# Patient Record
Sex: Female | Born: 1970 | Hispanic: No | Marital: Married | Smoking: Never smoker
Health system: Southern US, Community
[De-identification: ages and names within clinical notes are randomized; demographics above are authoritative.]

---

## 2007-10-16 ENCOUNTER — Inpatient Hospital Stay (HOSPITAL_COMMUNITY): Admission: AD | Admit: 2007-10-16 | Discharge: 2007-10-18 | Payer: Self-pay | Admitting: Obstetrics and Gynecology

## 2008-08-16 ENCOUNTER — Ambulatory Visit (HOSPITAL_BASED_OUTPATIENT_CLINIC_OR_DEPARTMENT_OTHER): Admission: RE | Admit: 2008-08-16 | Discharge: 2008-08-16 | Payer: Self-pay | Admitting: Otolaryngology

## 2008-08-16 ENCOUNTER — Encounter (INDEPENDENT_AMBULATORY_CARE_PROVIDER_SITE_OTHER): Payer: Self-pay | Admitting: Otolaryngology

## 2008-08-23 ENCOUNTER — Ambulatory Visit: Admission: RE | Admit: 2008-08-23 | Discharge: 2008-09-13 | Payer: Self-pay | Admitting: Radiation Oncology

## 2008-08-25 ENCOUNTER — Ambulatory Visit: Payer: Self-pay | Admitting: Internal Medicine

## 2008-08-26 LAB — CBC WITH DIFFERENTIAL/PLATELET
BASO%: 1 % (ref 0.0–2.0)
Basophils Absolute: 0.1 10*3/uL (ref 0.0–0.1)
HCT: 36 % (ref 34.8–46.6)
HGB: 11.9 g/dL (ref 11.6–15.9)
MCHC: 33.1 g/dL (ref 32.0–36.0)
MONO#: 0.9 10*3/uL (ref 0.1–0.9)
NEUT%: 70.3 % (ref 39.6–76.8)
RDW: 12.3 % (ref 11.3–14.5)
WBC: 8.6 10*3/uL (ref 3.9–10.0)
lymph#: 1.3 10*3/uL (ref 0.9–3.3)

## 2008-08-26 LAB — COMPREHENSIVE METABOLIC PANEL
ALT: 10 U/L (ref 0–35)
AST: 13 U/L (ref 0–37)
Alkaline Phosphatase: 77 U/L (ref 39–117)
Calcium: 9.3 mg/dL (ref 8.4–10.5)
Chloride: 104 mEq/L (ref 96–112)
Creatinine, Ser: 0.57 mg/dL (ref 0.40–1.20)
Potassium: 4 mEq/L (ref 3.5–5.3)

## 2008-08-26 LAB — LACTATE DEHYDROGENASE: LDH: 167 U/L (ref 94–250)

## 2008-08-26 LAB — HCG, QUANTITATIVE, PREGNANCY: hCG, Beta Chain, Quant, S: <2 m[IU]/mL

## 2008-08-31 ENCOUNTER — Ambulatory Visit (HOSPITAL_COMMUNITY): Admission: RE | Admit: 2008-08-31 | Discharge: 2008-08-31 | Payer: Self-pay | Admitting: Radiation Oncology

## 2008-08-31 LAB — COMPREHENSIVE METABOLIC PANEL
ALT: 11 U/L (ref 0–35)
AST: 13 U/L (ref 0–37)
Alkaline Phosphatase: 74 U/L (ref 39–117)
BUN: 10 mg/dL (ref 6–23)
Calcium: 9.2 mg/dL (ref 8.4–10.5)
Creatinine, Ser: 0.65 mg/dL (ref 0.40–1.20)
Total Bilirubin: 0.4 mg/dL (ref 0.3–1.2)

## 2008-08-31 LAB — CBC WITH DIFFERENTIAL/PLATELET
BASO%: 0.3 % (ref 0.0–2.0)
Basophils Absolute: 0 10*3/uL (ref 0.0–0.1)
EOS%: 4.3 % (ref 0.0–7.0)
HCT: 34.5 % — ABNORMAL LOW (ref 34.8–46.6)
HGB: 11.5 g/dL — ABNORMAL LOW (ref 11.6–15.9)
LYMPH%: 22.6 % (ref 14.0–48.0)
MCH: 30 pg (ref 26.0–34.0)
MCHC: 33.3 g/dL (ref 32.0–36.0)
MCV: 90.1 fL (ref 81.0–101.0)
MONO%: 10 % (ref 0.0–13.0)
NEUT%: 62.8 % (ref 39.6–76.8)
lymph#: 1.5 10*3/uL (ref 0.9–3.3)

## 2008-09-02 ENCOUNTER — Other Ambulatory Visit: Admission: RE | Admit: 2008-09-02 | Discharge: 2008-09-02 | Payer: Self-pay | Admitting: Internal Medicine

## 2008-09-02 ENCOUNTER — Encounter: Payer: Self-pay | Admitting: Internal Medicine

## 2008-09-02 ENCOUNTER — Ambulatory Visit (HOSPITAL_COMMUNITY): Admission: RE | Admit: 2008-09-02 | Discharge: 2008-09-02 | Payer: Self-pay | Admitting: Internal Medicine

## 2008-09-05 ENCOUNTER — Ambulatory Visit: Admission: RE | Admit: 2008-09-05 | Discharge: 2008-09-05 | Payer: Self-pay | Admitting: Internal Medicine

## 2008-09-20 LAB — CBC WITH DIFFERENTIAL/PLATELET
BASO%: 0.4 % (ref 0.0–2.0)
Basophils Absolute: 0 10*3/uL (ref 0.0–0.1)
HCT: 33.4 % — ABNORMAL LOW (ref 34.8–46.6)
HGB: 11.3 g/dL — ABNORMAL LOW (ref 11.6–15.9)
MCHC: 34 g/dL (ref 32.0–36.0)
MONO#: 0.9 10*3/uL (ref 0.1–0.9)
NEUT%: 79.8 % — ABNORMAL HIGH (ref 39.6–76.8)
RDW: 14.7 % — ABNORMAL HIGH (ref 11.3–14.5)
WBC: 9.1 10*3/uL (ref 3.9–10.0)
lymph#: 0.7 10*3/uL — ABNORMAL LOW (ref 0.9–3.3)

## 2008-09-20 LAB — LACTATE DEHYDROGENASE: LDH: 204 U/L (ref 94–250)

## 2008-09-20 LAB — COMPREHENSIVE METABOLIC PANEL
ALT: 9 U/L (ref 0–35)
AST: 13 U/L (ref 0–37)
Albumin: 3.7 g/dL (ref 3.5–5.2)
CO2: 26 mEq/L (ref 19–32)
Calcium: 8.9 mg/dL (ref 8.4–10.5)
Chloride: 102 mEq/L (ref 96–112)
Creatinine, Ser: 0.6 mg/dL (ref 0.40–1.20)
Potassium: 4.8 mEq/L (ref 3.5–5.3)

## 2008-09-27 LAB — CBC WITH DIFFERENTIAL/PLATELET
BASO%: 0.3 % (ref 0.0–2.0)
EOS%: 1.6 % (ref 0.0–7.0)
LYMPH%: 10.7 % — ABNORMAL LOW (ref 14.0–48.0)
MCH: 30 pg (ref 26.0–34.0)
MCHC: 33.5 g/dL (ref 32.0–36.0)
MCV: 89.3 fL (ref 81.0–101.0)
MONO%: 2.3 % (ref 0.0–13.0)
NEUT#: 7.5 10*3/uL — ABNORMAL HIGH (ref 1.5–6.5)
Platelets: 332 10*3/uL (ref 145–400)
RBC: 3.81 10*6/uL (ref 3.70–5.32)
RDW: 14.1 % (ref 11.3–14.5)

## 2008-09-27 LAB — COMPREHENSIVE METABOLIC PANEL
AST: 13 U/L (ref 0–37)
Albumin: 3.8 g/dL (ref 3.5–5.2)
Alkaline Phosphatase: 72 U/L (ref 39–117)
Glucose, Bld: 92 mg/dL (ref 70–99)
Potassium: 4.3 mEq/L (ref 3.5–5.3)
Sodium: 136 mEq/L (ref 135–145)
Total Bilirubin: 0.3 mg/dL (ref 0.3–1.2)
Total Protein: 7.2 g/dL (ref 6.0–8.3)

## 2008-10-04 ENCOUNTER — Ambulatory Visit: Payer: Self-pay | Admitting: Internal Medicine

## 2008-10-04 LAB — CBC WITH DIFFERENTIAL/PLATELET
BASO%: 0.9 % (ref 0.0–2.0)
EOS%: 1.9 % (ref 0.0–7.0)
Eosinophils Absolute: 0.1 10*3/uL (ref 0.0–0.5)
LYMPH%: 42.7 % (ref 14.0–48.0)
MCH: 29.7 pg (ref 26.0–34.0)
MCHC: 33.7 g/dL (ref 32.0–36.0)
MCV: 88.2 fL (ref 81.0–101.0)
MONO%: 11.5 % (ref 0.0–13.0)
Platelets: 339 10*3/uL (ref 145–400)
RBC: 3.88 10*6/uL (ref 3.70–5.32)

## 2008-10-04 LAB — COMPREHENSIVE METABOLIC PANEL
Alkaline Phosphatase: 53 U/L (ref 39–117)
Glucose, Bld: 152 mg/dL — ABNORMAL HIGH (ref 70–99)
Sodium: 141 mEq/L (ref 135–145)
Total Bilirubin: 0.5 mg/dL (ref 0.3–1.2)
Total Protein: 6.4 g/dL (ref 6.0–8.3)

## 2008-10-11 LAB — CBC WITH DIFFERENTIAL/PLATELET
Eosinophils Absolute: 0.1 10*3/uL (ref 0.0–0.5)
HCT: 35 % (ref 34.8–46.6)
LYMPH%: 25.7 % (ref 14.0–48.0)
MCHC: 33.9 g/dL (ref 32.0–36.0)
MONO#: 0.1 10*3/uL (ref 0.1–0.9)
NEUT#: 3.8 10*3/uL (ref 1.5–6.5)
NEUT%: 69.8 % (ref 39.6–76.8)
Platelets: 293 10*3/uL (ref 145–400)
WBC: 5.4 10*3/uL (ref 3.9–10.0)

## 2008-10-11 LAB — COMPREHENSIVE METABOLIC PANEL
CO2: 25 mEq/L (ref 19–32)
Creatinine, Ser: 0.76 mg/dL (ref 0.40–1.20)
Glucose, Bld: 103 mg/dL — ABNORMAL HIGH (ref 70–99)
Total Bilirubin: 0.8 mg/dL (ref 0.3–1.2)

## 2008-10-11 LAB — LACTATE DEHYDROGENASE: LDH: 128 U/L (ref 94–250)

## 2008-10-18 LAB — CBC WITH DIFFERENTIAL/PLATELET
MCH: 30 pg (ref 26.0–34.0)
MCHC: 34 g/dL (ref 32.0–36.0)
Platelets: 244 10*3/uL (ref 145–400)
RBC: 3.86 10*6/uL (ref 3.70–5.32)

## 2008-10-18 LAB — MANUAL DIFFERENTIAL
ALC: 2.4 10*3/uL (ref 0.9–3.3)
ANC (CHCC manual diff): 0.9 10*3/uL — ABNORMAL LOW (ref 1.5–6.5)
Basophil: 1 % (ref 0–2)
MONO: 17 % — ABNORMAL HIGH (ref 0–14)
Metamyelocytes: 0 % (ref 0–0)
Myelocytes: 0 % (ref 0–0)
Variant Lymph: 14 % — ABNORMAL HIGH (ref 0–0)

## 2008-10-18 LAB — COMPREHENSIVE METABOLIC PANEL
ALT: 11 U/L (ref 0–35)
AST: 17 U/L (ref 0–37)
Albumin: 3.8 g/dL (ref 3.5–5.2)
Alkaline Phosphatase: 49 U/L (ref 39–117)
BUN: 7 mg/dL (ref 6–23)
Calcium: 8.8 mg/dL (ref 8.4–10.5)
Chloride: 105 mEq/L (ref 96–112)
Potassium: 4.3 mEq/L (ref 3.5–5.3)
Sodium: 140 mEq/L (ref 135–145)
Total Protein: 7.2 g/dL (ref 6.0–8.3)

## 2008-11-01 LAB — CBC WITH DIFFERENTIAL/PLATELET
BASO%: 1.3 % (ref 0.0–2.0)
EOS%: 1.5 % (ref 0.0–7.0)
Eosinophils Absolute: 0.1 10*3/uL (ref 0.0–0.5)
LYMPH%: 25.6 % (ref 14.0–48.0)
MCH: 29.8 pg (ref 26.0–34.0)
MCHC: 33.9 g/dL (ref 32.0–36.0)
MCV: 88 fL (ref 81.0–101.0)
MONO%: 10 % (ref 0.0–13.0)
NEUT#: 4.5 10*3/uL (ref 1.5–6.5)
Platelets: 236 10*3/uL (ref 145–400)
RBC: 4.11 10*6/uL (ref 3.70–5.32)
RDW: 15.9 % — ABNORMAL HIGH (ref 11.3–14.5)

## 2008-11-01 LAB — COMPREHENSIVE METABOLIC PANEL
AST: 14 U/L (ref 0–37)
Alkaline Phosphatase: 59 U/L (ref 39–117)
BUN: 18 mg/dL (ref 6–23)
Calcium: 9.6 mg/dL (ref 8.4–10.5)
Chloride: 105 mEq/L (ref 96–112)
Creatinine, Ser: 0.68 mg/dL (ref 0.40–1.20)

## 2008-11-08 LAB — CBC WITH DIFFERENTIAL/PLATELET
Basophils Absolute: 0 10*3/uL (ref 0.0–0.1)
Eosinophils Absolute: 0.1 10*3/uL (ref 0.0–0.5)
HGB: 11.4 g/dL — ABNORMAL LOW (ref 11.6–15.9)
MCV: 89 fL (ref 79.5–101.0)
MONO%: 7.4 % (ref 0.0–14.0)
NEUT#: 9.1 10*3/uL — ABNORMAL HIGH (ref 1.5–6.5)
RDW: 16.3 % — ABNORMAL HIGH (ref 11.2–14.5)

## 2008-11-08 LAB — COMPREHENSIVE METABOLIC PANEL
Albumin: 3.8 g/dL (ref 3.5–5.2)
BUN: 13 mg/dL (ref 6–23)
Calcium: 8.8 mg/dL (ref 8.4–10.5)
Chloride: 105 mEq/L (ref 96–112)
Glucose, Bld: 97 mg/dL (ref 70–99)
Potassium: 3.8 mEq/L (ref 3.5–5.3)

## 2008-11-08 LAB — LACTATE DEHYDROGENASE: LDH: 146 U/L (ref 94–250)

## 2008-11-10 ENCOUNTER — Ambulatory Visit (HOSPITAL_COMMUNITY): Admission: RE | Admit: 2008-11-10 | Discharge: 2008-11-10 | Payer: Self-pay | Admitting: Internal Medicine

## 2008-11-14 LAB — CBC WITH DIFFERENTIAL/PLATELET
BASO%: 0.4 % (ref 0.0–2.0)
LYMPH%: 21.9 % (ref 14.0–49.7)
MCHC: 33.9 g/dL (ref 31.5–36.0)
MCV: 89.7 fL (ref 79.5–101.0)
MONO%: 11.5 % (ref 0.0–14.0)
Platelets: 287 10*3/uL (ref 145–400)
RBC: 3.92 10*6/uL (ref 3.70–5.45)
RDW: 17.5 % — ABNORMAL HIGH (ref 11.2–14.5)
WBC: 8.9 10*3/uL (ref 3.9–10.3)

## 2008-11-14 LAB — COMPREHENSIVE METABOLIC PANEL
ALT: 8 U/L (ref 0–35)
AST: 14 U/L (ref 0–37)
Alkaline Phosphatase: 66 U/L (ref 39–117)
Potassium: 4.4 mEq/L (ref 3.5–5.3)
Sodium: 138 mEq/L (ref 135–145)
Total Bilirubin: 0.4 mg/dL (ref 0.3–1.2)
Total Protein: 6.9 g/dL (ref 6.0–8.3)

## 2008-11-21 ENCOUNTER — Ambulatory Visit: Payer: Self-pay | Admitting: Internal Medicine

## 2008-11-21 LAB — CBC WITH DIFFERENTIAL/PLATELET
Eosinophils Absolute: 0 10*3/uL (ref 0.0–0.5)
HCT: 31.6 % — ABNORMAL LOW (ref 34.8–46.6)
LYMPH%: 11.9 % — ABNORMAL LOW (ref 14.0–49.7)
MONO#: 0.3 10*3/uL (ref 0.1–0.9)
NEUT#: 9.2 10*3/uL — ABNORMAL HIGH (ref 1.5–6.5)
NEUT%: 84.5 % — ABNORMAL HIGH (ref 38.4–76.8)
Platelets: 172 10*3/uL (ref 145–400)
WBC: 10.8 10*3/uL — ABNORMAL HIGH (ref 3.9–10.3)

## 2008-11-21 LAB — COMPREHENSIVE METABOLIC PANEL
CO2: 21 mEq/L (ref 19–32)
Calcium: 8.6 mg/dL (ref 8.4–10.5)
Chloride: 105 mEq/L (ref 96–112)
Creatinine, Ser: 0.55 mg/dL (ref 0.40–1.20)
Glucose, Bld: 81 mg/dL (ref 70–99)
Sodium: 139 mEq/L (ref 135–145)
Total Bilirubin: 1 mg/dL (ref 0.3–1.2)
Total Protein: 6.6 g/dL (ref 6.0–8.3)

## 2008-11-21 LAB — LACTATE DEHYDROGENASE: LDH: 175 U/L (ref 94–250)

## 2008-11-29 LAB — CBC WITH DIFFERENTIAL/PLATELET
BASO%: 0.4 % (ref 0.0–2.0)
EOS%: 2 % (ref 0.0–7.0)
HCT: 34.4 % — ABNORMAL LOW (ref 34.8–46.6)
MCHC: 32.8 g/dL (ref 31.5–36.0)
MONO#: 1.5 10*3/uL — ABNORMAL HIGH (ref 0.1–0.9)
NEUT%: 61.1 % (ref 38.4–76.8)
RDW: 18.4 % — ABNORMAL HIGH (ref 11.2–14.5)
WBC: 9.8 10*3/uL (ref 3.9–10.3)
lymph#: 2.1 10*3/uL (ref 0.9–3.3)
nRBC: 0 % (ref 0–0)

## 2008-11-29 LAB — COMPREHENSIVE METABOLIC PANEL
ALT: 9 U/L (ref 0–35)
AST: 15 U/L (ref 0–37)
Albumin: 3.8 g/dL (ref 3.5–5.2)
Alkaline Phosphatase: 57 U/L (ref 39–117)
BUN: 11 mg/dL (ref 6–23)
Calcium: 8.8 mg/dL (ref 8.4–10.5)
Chloride: 105 mEq/L (ref 96–112)
Potassium: 3.9 mEq/L (ref 3.5–5.3)
Sodium: 138 mEq/L (ref 135–145)
Total Protein: 6.6 g/dL (ref 6.0–8.3)

## 2008-12-06 LAB — CBC WITH DIFFERENTIAL/PLATELET
BASO%: 0.1 % (ref 0.0–2.0)
Basophils Absolute: 0 10*3/uL (ref 0.0–0.1)
EOS%: 0.5 % (ref 0.0–7.0)
Eosinophils Absolute: 0.1 10*3/uL (ref 0.0–0.5)
HCT: 30.1 % — ABNORMAL LOW (ref 34.8–46.6)
HGB: 10 g/dL — ABNORMAL LOW (ref 11.6–15.9)
LYMPH%: 7.2 % — ABNORMAL LOW (ref 14.0–49.7)
MCH: 29.5 pg (ref 25.1–34.0)
MCHC: 33.2 g/dL (ref 31.5–36.0)
MCV: 88.8 fL (ref 79.5–101.0)
MONO#: 1 10*3/uL — ABNORMAL HIGH (ref 0.1–0.9)
MONO%: 6.8 % (ref 0.0–14.0)
NEUT#: 12.9 10*3/uL — ABNORMAL HIGH (ref 1.5–6.5)
NEUT%: 85.4 % — ABNORMAL HIGH (ref 38.4–76.8)
Platelets: 246 10*3/uL (ref 145–400)
RBC: 3.39 10*6/uL — ABNORMAL LOW (ref 3.70–5.45)
RDW: 17.5 % — ABNORMAL HIGH (ref 11.2–14.5)
WBC: 15.1 10*3/uL — ABNORMAL HIGH (ref 3.9–10.3)
lymph#: 1.1 10*3/uL (ref 0.9–3.3)

## 2008-12-06 LAB — COMPREHENSIVE METABOLIC PANEL
ALT: 12 U/L (ref 0–35)
AST: 16 U/L (ref 0–37)
Albumin: 3.4 g/dL — ABNORMAL LOW (ref 3.5–5.2)
Alkaline Phosphatase: 87 U/L (ref 39–117)
BUN: 11 mg/dL (ref 6–23)
CO2: 25 mEq/L (ref 19–32)
Calcium: 8.4 mg/dL (ref 8.4–10.5)
Chloride: 107 mEq/L (ref 96–112)
Creatinine, Ser: 0.54 mg/dL (ref 0.40–1.20)
Glucose, Bld: 103 mg/dL — ABNORMAL HIGH (ref 70–99)
Potassium: 3.7 mEq/L (ref 3.5–5.3)
Sodium: 137 mEq/L (ref 135–145)
Total Bilirubin: 0.5 mg/dL (ref 0.3–1.2)
Total Protein: 6.7 g/dL (ref 6.0–8.3)

## 2008-12-06 LAB — LACTATE DEHYDROGENASE: LDH: 144 U/L (ref 94–250)

## 2008-12-13 LAB — COMPREHENSIVE METABOLIC PANEL
ALT: 11 U/L (ref 0–35)
Albumin: 4.3 g/dL (ref 3.5–5.2)
CO2: 20 mEq/L (ref 19–32)
Calcium: 8.9 mg/dL (ref 8.4–10.5)
Chloride: 106 mEq/L (ref 96–112)
Glucose, Bld: 87 mg/dL (ref 70–99)
Potassium: 4.4 mEq/L (ref 3.5–5.3)
Sodium: 139 mEq/L (ref 135–145)
Total Bilirubin: 0.4 mg/dL (ref 0.3–1.2)
Total Protein: 7.5 g/dL (ref 6.0–8.3)

## 2008-12-13 LAB — CBC WITH DIFFERENTIAL/PLATELET
Basophils Absolute: 0 10*3/uL (ref 0.0–0.1)
Eosinophils Absolute: 0.2 10*3/uL (ref 0.0–0.5)
HGB: 11.1 g/dL — ABNORMAL LOW (ref 11.6–15.9)
MONO#: 1.2 10*3/uL — ABNORMAL HIGH (ref 0.1–0.9)
NEUT#: 5.7 10*3/uL (ref 1.5–6.5)
RBC: 3.82 10*6/uL (ref 3.70–5.45)
RDW: 17.8 % — ABNORMAL HIGH (ref 11.2–14.5)
WBC: 9.1 10*3/uL (ref 3.9–10.3)
lymph#: 2 10*3/uL (ref 0.9–3.3)
nRBC: 0 % (ref 0–0)

## 2008-12-13 LAB — LACTATE DEHYDROGENASE: LDH: 251 U/L — ABNORMAL HIGH (ref 94–250)

## 2008-12-19 LAB — CBC WITH DIFFERENTIAL/PLATELET
Basophils Absolute: 0.1 10*3/uL (ref 0.0–0.1)
EOS%: 0.8 % (ref 0.0–7.0)
HCT: 33.8 % — ABNORMAL LOW (ref 34.8–46.6)
HGB: 11.4 g/dL — ABNORMAL LOW (ref 11.6–15.9)
LYMPH%: 9.8 % — ABNORMAL LOW (ref 14.0–49.7)
MCH: 30.6 pg (ref 25.1–34.0)
MCV: 90.9 fL (ref 79.5–101.0)
MONO%: 4.4 % (ref 0.0–14.0)
NEUT%: 84.4 % — ABNORMAL HIGH (ref 38.4–76.8)
Platelets: 227 10*3/uL (ref 145–400)
RDW: 18.6 % — ABNORMAL HIGH (ref 11.2–14.5)

## 2008-12-19 LAB — COMPREHENSIVE METABOLIC PANEL
AST: 12 U/L (ref 0–37)
Alkaline Phosphatase: 110 U/L (ref 39–117)
BUN: 15 mg/dL (ref 6–23)
Creatinine, Ser: 0.58 mg/dL (ref 0.40–1.20)
Total Bilirubin: 0.7 mg/dL (ref 0.3–1.2)

## 2008-12-27 LAB — CBC WITH DIFFERENTIAL/PLATELET
Basophils Absolute: 0.1 10*3/uL (ref 0.0–0.1)
Eosinophils Absolute: 0.2 10*3/uL (ref 0.0–0.5)
HGB: 11.3 g/dL — ABNORMAL LOW (ref 11.6–15.9)
LYMPH%: 15.6 % (ref 14.0–49.7)
MCV: 90.6 fL (ref 79.5–101.0)
MONO#: 1.1 10*3/uL — ABNORMAL HIGH (ref 0.1–0.9)
MONO%: 11.9 % (ref 0.0–14.0)
NEUT#: 6.6 10*3/uL — ABNORMAL HIGH (ref 1.5–6.5)
Platelets: 286 10*3/uL (ref 145–400)
WBC: 9.3 10*3/uL (ref 3.9–10.3)

## 2008-12-27 LAB — COMPREHENSIVE METABOLIC PANEL
Albumin: 3.9 g/dL (ref 3.5–5.2)
Alkaline Phosphatase: 65 U/L (ref 39–117)
BUN: 13 mg/dL (ref 6–23)
Calcium: 9 mg/dL (ref 8.4–10.5)
Chloride: 106 mEq/L (ref 96–112)
Glucose, Bld: 113 mg/dL — ABNORMAL HIGH (ref 70–99)
Potassium: 4.2 mEq/L (ref 3.5–5.3)
Sodium: 139 mEq/L (ref 135–145)
Total Protein: 6.8 g/dL (ref 6.0–8.3)

## 2008-12-27 LAB — LACTATE DEHYDROGENASE: LDH: 192 U/L (ref 94–250)

## 2009-01-06 ENCOUNTER — Encounter: Admission: RE | Admit: 2009-01-06 | Discharge: 2009-01-06 | Payer: Self-pay | Admitting: Internal Medicine

## 2009-01-24 ENCOUNTER — Ambulatory Visit: Payer: Self-pay | Admitting: Internal Medicine

## 2009-01-26 ENCOUNTER — Ambulatory Visit: Admission: RE | Admit: 2009-01-26 | Discharge: 2009-03-20 | Payer: Self-pay | Admitting: Radiation Oncology

## 2009-01-27 ENCOUNTER — Ambulatory Visit (HOSPITAL_COMMUNITY): Admission: RE | Admit: 2009-01-27 | Discharge: 2009-01-27 | Payer: Self-pay | Admitting: Internal Medicine

## 2009-01-31 LAB — CBC WITH DIFFERENTIAL/PLATELET
Basophils Absolute: 0 10*3/uL (ref 0.0–0.1)
EOS%: 1.1 % (ref 0.0–7.0)
HCT: 35 % (ref 34.8–46.6)
HGB: 11.7 g/dL (ref 11.6–15.9)
MCH: 32.1 pg (ref 25.1–34.0)
MCV: 95.9 fL (ref 79.5–101.0)
MONO%: 7.9 % (ref 0.0–14.0)
NEUT%: 75.1 % (ref 38.4–76.8)

## 2009-01-31 LAB — COMPREHENSIVE METABOLIC PANEL
ALT: 12 U/L (ref 0–35)
AST: 15 U/L (ref 0–37)
Calcium: 8.9 mg/dL (ref 8.4–10.5)
Chloride: 106 mEq/L (ref 96–112)
Creatinine, Ser: 0.93 mg/dL (ref 0.40–1.20)
Potassium: 4.2 mEq/L (ref 3.5–5.3)

## 2009-02-09 ENCOUNTER — Ambulatory Visit: Admission: RE | Admit: 2009-02-09 | Discharge: 2009-02-09 | Payer: Self-pay | Admitting: Internal Medicine

## 2009-04-27 ENCOUNTER — Ambulatory Visit (HOSPITAL_COMMUNITY): Admission: RE | Admit: 2009-04-27 | Discharge: 2009-04-27 | Payer: Self-pay | Admitting: Radiation Oncology

## 2009-05-15 ENCOUNTER — Ambulatory Visit: Payer: Self-pay | Admitting: Internal Medicine

## 2009-05-15 ENCOUNTER — Ambulatory Visit (HOSPITAL_COMMUNITY): Admission: RE | Admit: 2009-05-15 | Discharge: 2009-05-15 | Payer: Self-pay | Admitting: Internal Medicine

## 2009-05-15 LAB — COMPREHENSIVE METABOLIC PANEL
ALT: 8 U/L (ref 0–35)
AST: 12 U/L (ref 0–37)
Calcium: 9 mg/dL (ref 8.4–10.5)
Chloride: 107 mEq/L (ref 96–112)
Creatinine, Ser: 0.75 mg/dL (ref 0.40–1.20)
Sodium: 138 mEq/L (ref 135–145)
Total Bilirubin: 0.8 mg/dL (ref 0.3–1.2)
Total Protein: 6.8 g/dL (ref 6.0–8.3)

## 2009-05-15 LAB — CBC WITH DIFFERENTIAL/PLATELET
BASO%: 0.6 % (ref 0.0–2.0)
EOS%: 2.8 % (ref 0.0–7.0)
HCT: 35.6 % (ref 34.8–46.6)
MCH: 29.9 pg (ref 25.1–34.0)
MCHC: 33.4 g/dL (ref 31.5–36.0)
NEUT%: 54.1 % (ref 38.4–76.8)
RBC: 3.98 10*6/uL (ref 3.70–5.45)
WBC: 3.3 10*3/uL — ABNORMAL LOW (ref 3.9–10.3)
lymph#: 1 10*3/uL (ref 0.9–3.3)

## 2009-09-04 ENCOUNTER — Ambulatory Visit: Payer: Self-pay | Admitting: Internal Medicine

## 2009-09-11 ENCOUNTER — Ambulatory Visit (HOSPITAL_COMMUNITY): Admission: RE | Admit: 2009-09-11 | Discharge: 2009-09-11 | Payer: Self-pay | Admitting: Internal Medicine

## 2009-09-11 LAB — COMPREHENSIVE METABOLIC PANEL
ALT: 14 U/L (ref 0–35)
AST: 19 U/L (ref 0–37)
Albumin: 3.9 g/dL (ref 3.5–5.2)
CO2: 27 mEq/L (ref 19–32)
Calcium: 9 mg/dL (ref 8.4–10.5)
Chloride: 104 mEq/L (ref 96–112)
Potassium: 4 mEq/L (ref 3.5–5.3)
Sodium: 140 mEq/L (ref 135–145)
Total Protein: 7.1 g/dL (ref 6.0–8.3)

## 2009-09-11 LAB — CBC WITH DIFFERENTIAL/PLATELET
Basophils Absolute: 0 10*3/uL (ref 0.0–0.1)
EOS%: 2.6 % (ref 0.0–7.0)
Eosinophils Absolute: 0.2 10*3/uL (ref 0.0–0.5)
HGB: 11.6 g/dL (ref 11.6–15.9)
NEUT#: 4.4 10*3/uL (ref 1.5–6.5)
RDW: 14 % (ref 11.2–14.5)
lymph#: 0.9 10*3/uL (ref 0.9–3.3)

## 2010-01-18 IMAGING — CT CT NECK W/ CM
3 of 4 series · 16 of 33 positions shown, 19 images · IV contrast (agent unspecified)
Comparison: PET scan 08/31/2008.

CLINICAL DATA: Hodgkin's disease.

CT NECK WITH CONTRAST
TECHNIQUE: Multidetector CT imaging of the neck was performed with
intravenous contrast.
Contrast: 100 ml Pmnipaque-H22 IV.

[Series 2: neck 3.0 b40s · axial · 0.44mm/px · z∈[+818,+1010]mm · 8 of 82 slices shown, 10 images]
[im 9/82  soft-tissue]
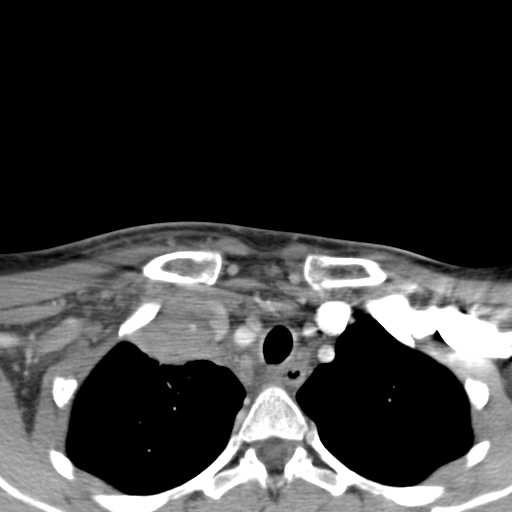
[im 9/82  bone]
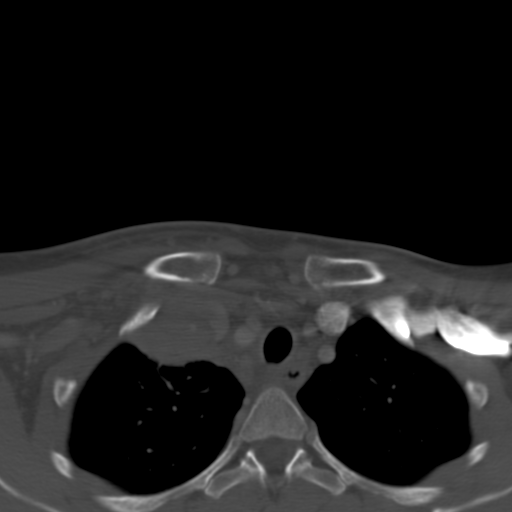
[im 17/82  bone]
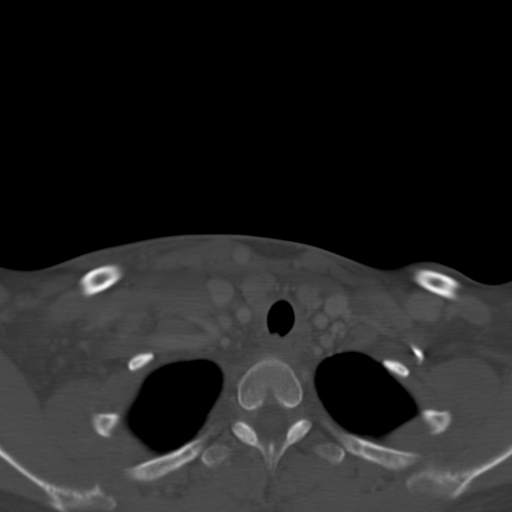
[im 25/82  bone]
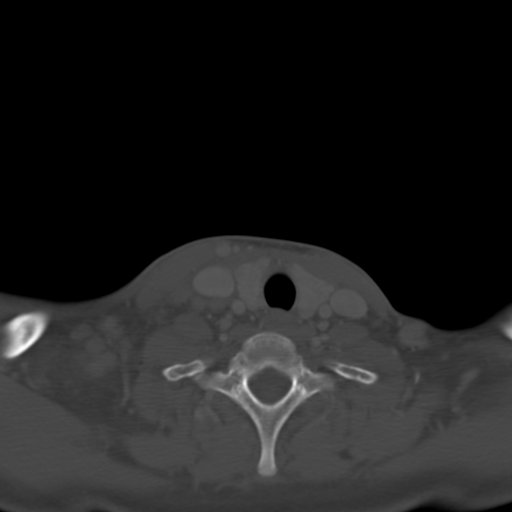
[im 33/82  bone]
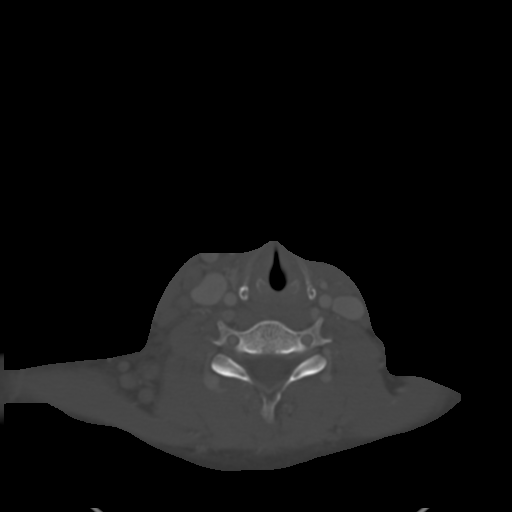
[im 49/82  soft-tissue]
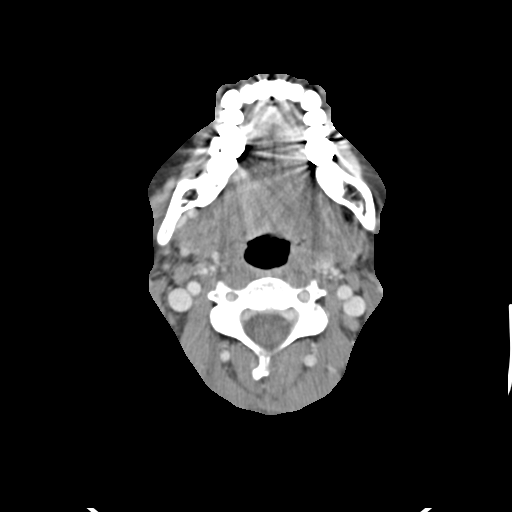
[im 49/82  bone]
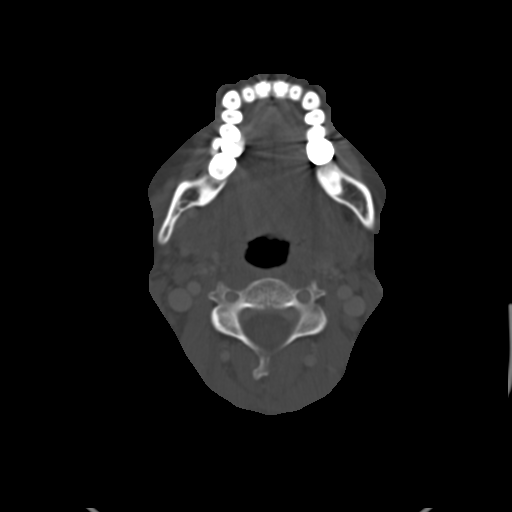
[im 57/82  bone]
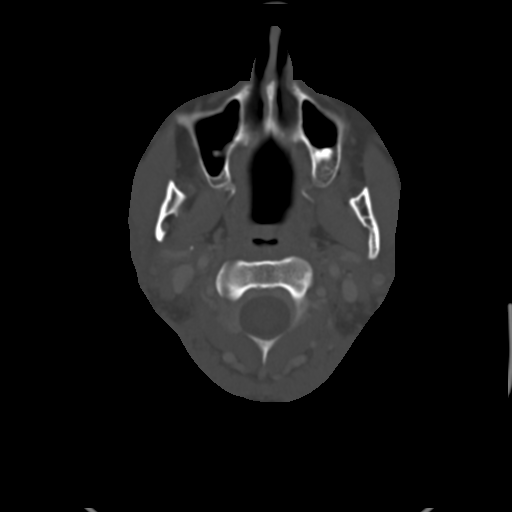
[im 65/82  bone]
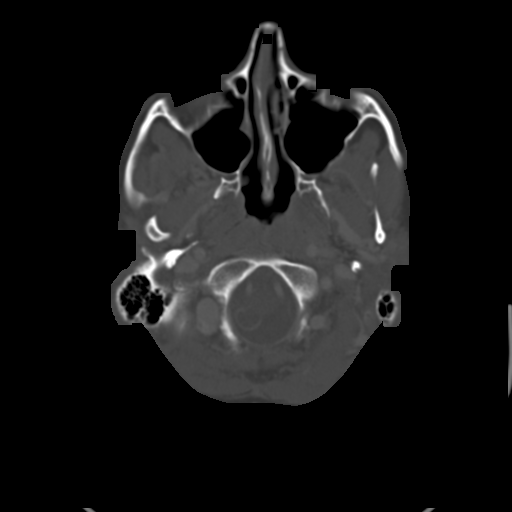
[im 73/82  bone]
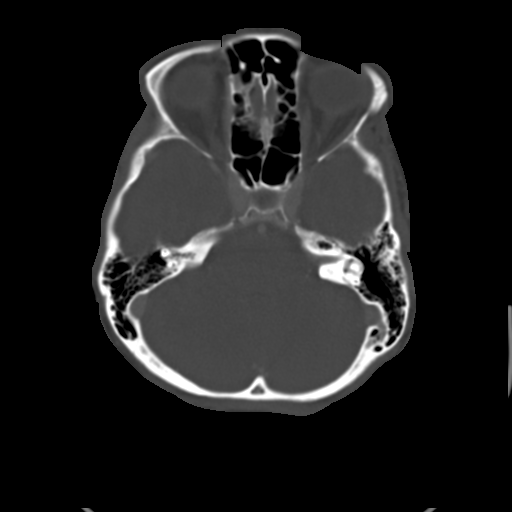

[Series 602: <mpr thick range> · coronal · 0.48mm/px · 3 of 108 slices shown]
[im 22/108  bone]
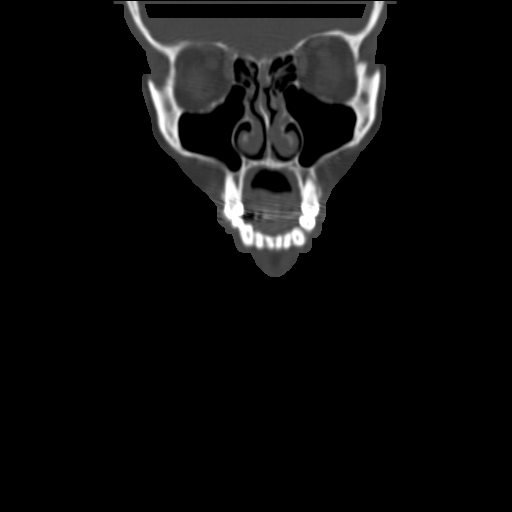
[im 43/108  bone]
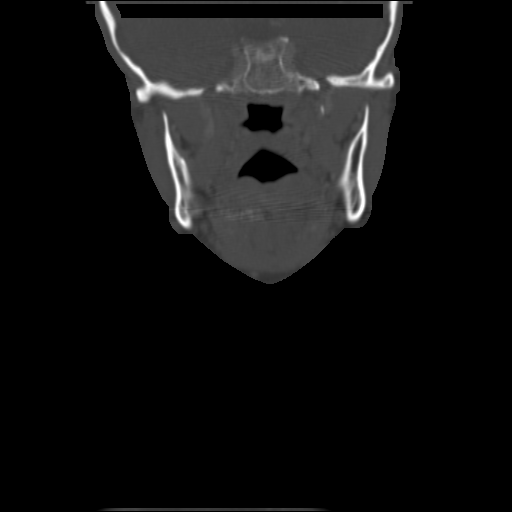
[im 65/108  bone]
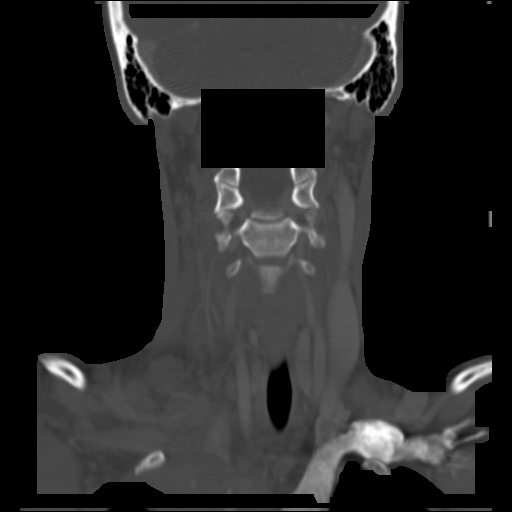

[Series 603: <mpr thick range(1)> · sagittal · 0.48mm/px · 5 of 108 slices shown, 6 images]
[im 36/108  bone]
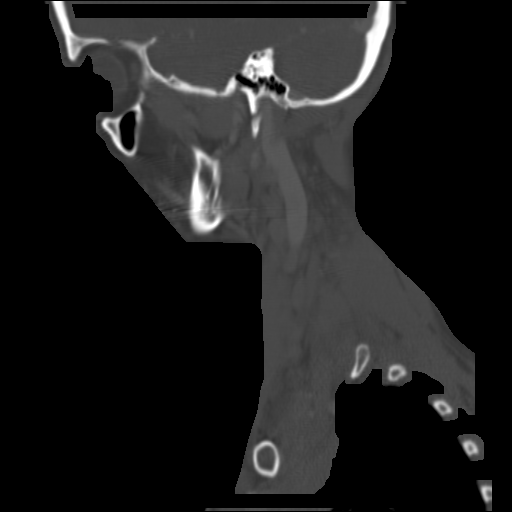
[im 45/108  bone]
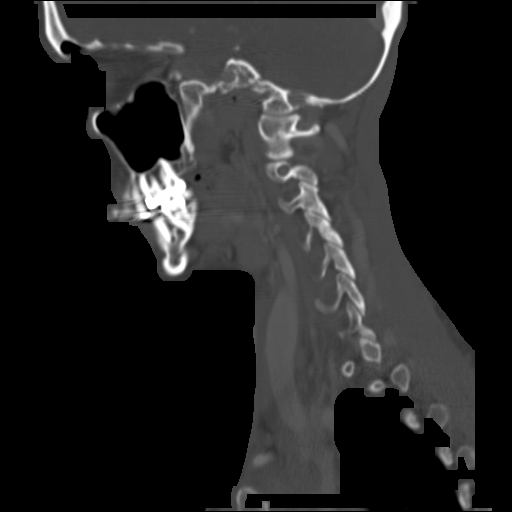
[im 54/108  soft-tissue]
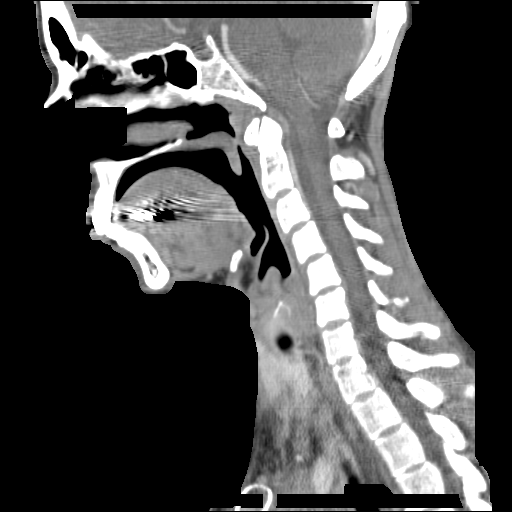
[im 54/108  bone]
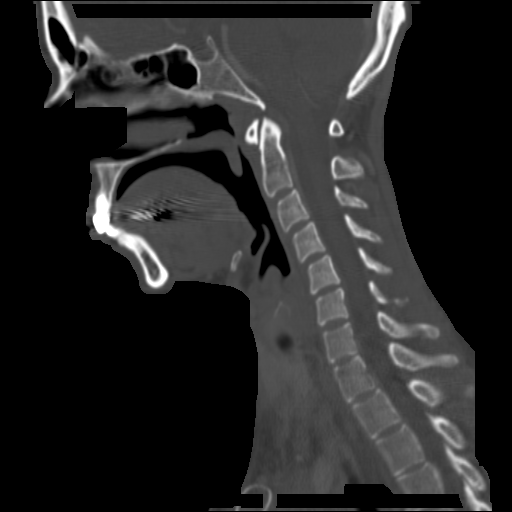
[im 63/108  bone]
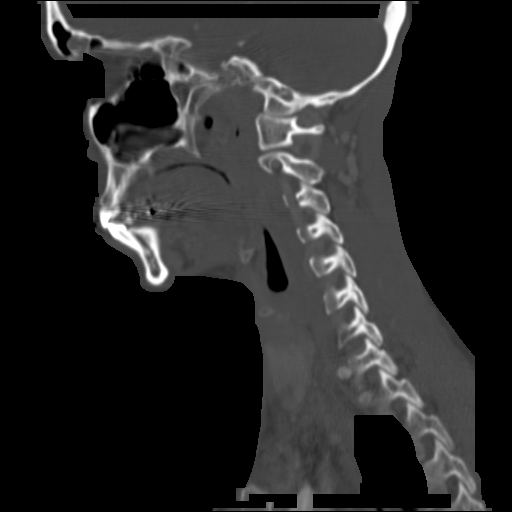
[im 72/108  bone]
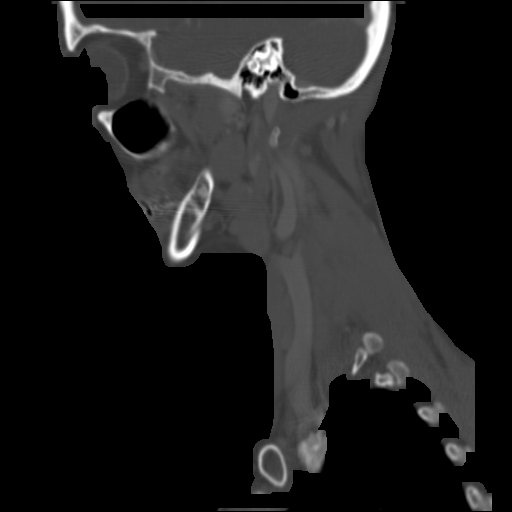

[16 of 33 positions shown; findings below may reference images not displayed]

FINDINGS: There is cervical adenopathy which is primarily on the
right side.  The largest nodal mass is in the right superior
mediastinum and measures approximately 34 x 28 mm.  Right
supraclavicular lymph node mass measures 25 x 30 mm.  There are
several lymph nodes in the right axilla which measure under 1 mm
under 1 cm.  There also is a group of 10 mm nodes in the right
lower lateral neck.  There is a 10 x 13 mm level III node on the
right as well as some 1 cm level II nodes on the right.

The larynx is normal and the thyroid is normal.
IMPRESSION: Extensive cervical adenopathy on the right corresponding to
hypermetabolic nodes on the PET scan.  These are compatible with
Hodgkin's disease.

## 2010-02-22 ENCOUNTER — Ambulatory Visit: Payer: Self-pay | Admitting: Internal Medicine

## 2010-02-26 ENCOUNTER — Ambulatory Visit (HOSPITAL_COMMUNITY): Admission: RE | Admit: 2010-02-26 | Discharge: 2010-02-26 | Payer: Self-pay | Admitting: Internal Medicine

## 2010-02-26 LAB — CBC WITH DIFFERENTIAL/PLATELET
Basophils Absolute: 0 10*3/uL (ref 0.0–0.1)
HCT: 37.5 % (ref 34.8–46.6)
HGB: 12.9 g/dL (ref 11.6–15.9)
MONO#: 0.4 10*3/uL (ref 0.1–0.9)
NEUT#: 2 10*3/uL (ref 1.5–6.5)
NEUT%: 55.8 % (ref 38.4–76.8)
WBC: 3.6 10*3/uL — ABNORMAL LOW (ref 3.9–10.3)
lymph#: 1.1 10*3/uL (ref 0.9–3.3)

## 2010-02-26 LAB — COMPREHENSIVE METABOLIC PANEL
ALT: 11 U/L (ref 0–35)
Albumin: 4 g/dL (ref 3.5–5.2)
BUN: 9 mg/dL (ref 6–23)
CO2: 26 mEq/L (ref 19–32)
Calcium: 9.2 mg/dL (ref 8.4–10.5)
Chloride: 108 mEq/L (ref 96–112)
Creatinine, Ser: 0.76 mg/dL (ref 0.40–1.20)

## 2010-02-26 LAB — LACTATE DEHYDROGENASE: LDH: 145 U/L (ref 94–250)

## 2010-07-04 ENCOUNTER — Encounter: Admission: RE | Admit: 2010-07-04 | Discharge: 2010-07-04 | Payer: Self-pay | Admitting: Family Medicine

## 2010-08-23 ENCOUNTER — Ambulatory Visit: Payer: Self-pay | Admitting: Internal Medicine

## 2010-08-27 ENCOUNTER — Ambulatory Visit (HOSPITAL_COMMUNITY): Admission: RE | Admit: 2010-08-27 | Payer: Self-pay | Source: Home / Self Care | Admitting: Internal Medicine

## 2010-08-28 ENCOUNTER — Ambulatory Visit (HOSPITAL_COMMUNITY)
Admission: RE | Admit: 2010-08-28 | Discharge: 2010-08-28 | Payer: Self-pay | Source: Home / Self Care | Attending: Internal Medicine | Admitting: Internal Medicine

## 2010-08-28 LAB — COMPREHENSIVE METABOLIC PANEL
ALT: 12 U/L (ref 0–35)
AST: 18 U/L (ref 0–37)
Albumin: 4 g/dL (ref 3.5–5.2)
Alkaline Phosphatase: 36 U/L — ABNORMAL LOW (ref 39–117)
BUN: 12 mg/dL (ref 6–23)
Calcium: 9.5 mg/dL (ref 8.4–10.5)
Chloride: 107 mEq/L (ref 96–112)
Potassium: 4.1 mEq/L (ref 3.5–5.3)
Sodium: 141 mEq/L (ref 135–145)
Total Protein: 7.4 g/dL (ref 6.0–8.3)

## 2010-08-28 LAB — CBC WITH DIFFERENTIAL/PLATELET
Basophils Absolute: 0 10*3/uL (ref 0.0–0.1)
EOS%: 2.5 % (ref 0.0–7.0)
HGB: 12.4 g/dL (ref 11.6–15.9)
MCH: 33.3 pg (ref 25.1–34.0)
NEUT#: 2.4 10*3/uL (ref 1.5–6.5)
RBC: 3.74 10*6/uL (ref 3.70–5.45)
RDW: 12.7 % (ref 11.2–14.5)
lymph#: 1.2 10*3/uL (ref 0.9–3.3)

## 2010-10-06 ENCOUNTER — Other Ambulatory Visit: Payer: Self-pay | Admitting: Internal Medicine

## 2010-10-06 DIAGNOSIS — C8190 Hodgkin lymphoma, unspecified, unspecified site: Secondary | ICD-10-CM

## 2010-10-07 ENCOUNTER — Encounter: Payer: Self-pay | Admitting: Internal Medicine

## 2010-10-08 ENCOUNTER — Encounter: Payer: Self-pay | Admitting: Internal Medicine

## 2010-11-08 ENCOUNTER — Ambulatory Visit: Payer: BC Managed Care – PPO | Attending: Radiation Oncology | Admitting: Radiation Oncology

## 2010-12-25 LAB — GLUCOSE, CAPILLARY: Glucose-Capillary: 81 mg/dL (ref 70–99)

## 2011-01-01 LAB — GLUCOSE, CAPILLARY: Glucose-Capillary: 84 mg/dL (ref 70–99)

## 2011-01-13 ENCOUNTER — Encounter: Payer: Self-pay | Admitting: Family Medicine

## 2011-01-13 ENCOUNTER — Inpatient Hospital Stay (INDEPENDENT_AMBULATORY_CARE_PROVIDER_SITE_OTHER)
Admission: RE | Admit: 2011-01-13 | Discharge: 2011-01-13 | Disposition: A | Discharge status: Home / Self Care | Payer: BC Managed Care – PPO | Source: Ambulatory Visit | Attending: Family Medicine | Admitting: Family Medicine

## 2011-01-13 DIAGNOSIS — J309 Allergic rhinitis, unspecified: Secondary | ICD-10-CM | POA: Insufficient documentation

## 2011-01-13 DIAGNOSIS — S0180XA Unspecified open wound of other part of head, initial encounter: Secondary | ICD-10-CM

## 2011-01-17 ENCOUNTER — Telehealth (INDEPENDENT_AMBULATORY_CARE_PROVIDER_SITE_OTHER): Payer: Self-pay | Admitting: Emergency Medicine

## 2011-01-29 NOTE — Op Note (Signed)
Brittany Harrington, HOLZHEIMER              ACCOUNT NO.:  [id number]   MEDICAL RECORD NO.:  [id number]          PATIENT TYPE:  AMB   LOCATION:  [facility]                          FACILITY:  [facility]   PHYSICIAN:  Christopher E. Ezzard Standing, M.D.DATE OF BIRTH:  [birth year]   DATE OF PROCEDURE:  08/16/2008  DATE OF DISCHARGE:                               OPERATIVE REPORT   PREOPERATIVE DIAGNOSIS:  Right neck right supraclavicular  lymphadenopathy.   POSTOPERATIVE DIAGNOSIS:  Right neck right supraclavicular  lymphadenopathy.   OPERATION:  Excisional biopsy of right neck node.   SURGEON:  Kristine Garbe. Ezzard Standing, MD   ANESTHESIA:  MAC.   COMPLICATIONS:  None.   BRIEF CLINICAL NOTE:  Honey is a 40 year old female who first noticed a  swelling in her right neck about 3 weeks ago.  She had chest x-ray and  lab work which was nondiagnostic.  She denies any pain associated with  lymphadenopathy.  On exam, she has several small one to two right  posterior cervical lymph nodes along the facial lymph nodes and then a  large conglomerate of right supraclavicular lymph nodes several of these  over 2 cm size.  She was taken to the operating room at this time for  excisional biopsy of right neck node.  We will plan on excisional biopsy  of one of the more superficial accessory lymph nodes.   DESCRIPTION OF PROCEDURE:  The patient was brought to the operating  room.  After IV sedation, the right neck was prepped with Betadine  solution.  She had two nodules posterior mid right neck that were marked  out.  This area was injected with Xylocaine with epinephrine for local  anesthetic.  A  horizontal incision was made directly over the nodules.  Dissection was carried out through the subcutaneous tissue and  subcutaneous fat.  The nodules were palpated then carefully dissected  out and sent to pathology in saline.  Hemostasis was obtained with  cautery and a single 4-0 silk ligature.  After obtaining adequate  hemostasis, the wound was closed with 3-0 chromic sutures subcutaneously  and 6-0 nylon to reapproximate the skin edges.  Bacitracin ointment and  dressing was applied.  Thanya tolerated this well.   DISPOSITION:  Rox is discharged home later this morning.  We will have  her followup in my office in 6 days to review pathology and have sutures  removed.           ______________________________  Kristine Garbe Ezzard Standing, M.D.    CEN/MEDQ  D:  08/16/2008  T:  08/16/2008  Job:  [id number]   cc:   Evelena Peat, M.D.

## 2011-06-06 LAB — CBC
HCT: 32.6 — ABNORMAL LOW
Hemoglobin: 10.4 — ABNORMAL LOW
Hemoglobin: 11.1 — ABNORMAL LOW
MCHC: 34.5
MCV: 90.8
MCV: 91
RBC: 3.31 — ABNORMAL LOW
RDW: 13.4
RDW: 13.5
WBC: 7.9

## 2011-06-21 LAB — CBC
Hemoglobin: 12.7 g/dL (ref 12.0–15.0)
MCHC: 33.1 g/dL (ref 30.0–36.0)
MCV: 90.7 fL (ref 78.0–100.0)
RBC: 4.23 MIL/uL (ref 3.87–5.11)

## 2011-06-21 LAB — DIFFERENTIAL
Eosinophils Absolute: 0.2 10*3/uL (ref 0.0–0.7)
Eosinophils Relative: 4 % (ref 0–5)
Lymphs Abs: 0.9 10*3/uL (ref 0.7–4.0)
Monocytes Absolute: 0.5 10*3/uL (ref 0.1–1.0)
Monocytes Relative: 8 % (ref 3–12)

## 2011-07-16 ENCOUNTER — Other Ambulatory Visit: Payer: Self-pay | Admitting: Internal Medicine

## 2011-07-16 DIAGNOSIS — Z1231 Encounter for screening mammogram for malignant neoplasm of breast: Secondary | ICD-10-CM

## 2011-08-05 ENCOUNTER — Ambulatory Visit
Admission: RE | Admit: 2011-08-05 | Discharge: 2011-08-05 | Disposition: A | Discharge status: Home / Self Care | Payer: BC Managed Care – PPO | Source: Ambulatory Visit | Attending: Internal Medicine | Admitting: Internal Medicine

## 2011-08-05 DIAGNOSIS — Z1231 Encounter for screening mammogram for malignant neoplasm of breast: Secondary | ICD-10-CM

## 2011-08-19 NOTE — Telephone Encounter (Signed)
  Phone Note Outgoing Call   Call placed by: Lavell Islam RN,  Jan 17, 2011 12:08 PM Call placed to: Patient Action Taken: Phone Call Completed Summary of Call: Patient states laceration healing well. Initial call taken by: Lavell Islam RN,  Jan 17, 2011 12:09 PM

## 2011-08-19 NOTE — Progress Notes (Signed)
Summary: cut over eyebrow/TM (proc. rm)   Vital Signs:  Patient Profile:   40 Years Old Female CC:      laceration to right brow @ 1045AM Height:     67 inches Weight:      153 pounds O2 Sat:      99 % O2 treatment:    Room Air Temp:     98.6 degrees F oral Pulse rate:   69 / minute Resp:     12 per minute BP sitting:   106 / 70  (left arm) Cuff size:   regular  Pt. in pain?   no  Vitals Entered By: Lajean Saver RN (January 13, 2011 11:32 AM)                   Updated Prior Medication List: Joyce Copa ALLERGY 180 MG TABS (FEXOFENADINE HCL) once daily  Current Allergies: No known allergies History of Present Illness Chief Complaint: laceration to right brow @ 1045AM History of Present Illness:  Subjective:  Patient complains of bumping her head this morning resulting in laceration over right eyebrow.  No loss of consciousness.  No headache.  No neuro symptoms.  Last tetanus shot 1 year ago  REVIEW OF SYSTEMS Constitutional Symptoms      Denies fever, chills, night sweats, weight loss, weight gain, and fatigue.  Eyes       Denies change in vision, eye pain, eye discharge, glasses, contact lenses, and eye surgery. Ear/Nose/Throat/Mouth       Denies hearing loss/aids, change in hearing, ear pain, ear discharge, dizziness, frequent runny nose, frequent nose bleeds, sinus problems, sore throat, hoarseness, and tooth pain or bleeding.  Respiratory       Denies dry cough, productive cough, wheezing, shortness of breath, asthma, bronchitis, and emphysema/COPD.  Cardiovascular       Denies murmurs, chest pain, and tires easily with exhertion.    Gastrointestinal       Denies stomach pain, nausea/vomiting, diarrhea, constipation, blood in bowel movements, and indigestion. Genitourniary       Denies painful urination, kidney stones, and loss of urinary control. Neurological       Denies paralysis, seizures, and fainting/blackouts. Musculoskeletal       Denies muscle pain,  joint pain, joint stiffness, decreased range of motion, redness, swelling, muscle weakness, and gout.  Skin       Denies bruising, unusual mles/lumps or sores, and hair/skin or nail changes.  Psych       Denies mood changes, temper/anger issues, anxiety/stress, speech problems, depression, and sleep problems. Other Comments: Patient was closing car door and hit her right brow. No loss of consciousness, no HA. Bleeding stopped, cleaned with Hibiclens   Past History:  Past Medical History: Allergic rhinitis Hodgkins lymphoma  Past Surgical History: Biopsy to neck  Social History: Married Never Smoked Alcohol use-yes Drug use-no Smoking Status:  never Drug Use:  no   Objective:  Appearance:  Patient appears healthy, stated age, and in no acute distress  Eyes:  Pupils are equal, round, and reactive to light and accomdation.  Extraocular movement is intact.  Conjunctivae are not inflamed.  Neurologic:  Cranial nerves 2 through 12 are normal.    Skin:  Over the right eyebrow is a superficial simple 2cm laceration.  Wound appears clean without debris.   Assessment New Problems: LACERATION, FACE (ICD-873.40) ALLERGIC RHINITIS (ICD-477.9)   Plan New Orders: Services provided After hours-Weekends-Holidays [99051] New Patient Level III [99203] Repair Superfical Wound(s) 2.5cm  or< (face,ears,eyelids,nose,lips,mm) [12011] Planning Comments:   Given Dermabond instruction sheet. Return for any signs of infection   The patient and/or caregiver has been counseled thoroughly with regard to medications prescribed including dosage, schedule, interactions, rationale for use, and possible side effects and they verbalize understanding.  Diagnoses and expected course of recovery discussed and will return if not improved as expected or if the condition worsens. Patient and/or caregiver verbalized understanding.   PROCEDURE:  Dermabond Site: Right eyebrow Size: 2cm Number of Lacerations:  One Anesthesia: None Procedure: Procedure:  Laceration Repair Discussed benefits and risks of procedure and verbal consent obtained. Using sterile technique, cleansed wound with Betadine followed by copious lavage with normal saline.  Wound carefully inspected for debris and foreign bodies; none found.  Wound closed wth Dermabond.  Wound precautions explained to patient.    Disposition: Home  Orders Added: 1)  Services provided After hours-Weekends-Holidays [99051] 2)  New Patient Level III [16109] 3)  Repair Superfical Wound(s) 2.5cm or< (face,ears,eyelids,nose,lips,mm) [12011]

## 2011-08-21 ENCOUNTER — Other Ambulatory Visit: Payer: Self-pay | Admitting: Internal Medicine

## 2011-08-21 ENCOUNTER — Other Ambulatory Visit (HOSPITAL_COMMUNITY): Payer: Self-pay

## 2011-08-21 ENCOUNTER — Ambulatory Visit (HOSPITAL_COMMUNITY)
Admission: RE | Admit: 2011-08-21 | Discharge: 2011-08-21 | Disposition: A | Discharge status: Home / Self Care | Payer: BC Managed Care – PPO | Source: Ambulatory Visit | Attending: Internal Medicine | Admitting: Internal Medicine

## 2011-08-21 ENCOUNTER — Other Ambulatory Visit (HOSPITAL_BASED_OUTPATIENT_CLINIC_OR_DEPARTMENT_OTHER): Payer: BC Managed Care – PPO | Admitting: Lab

## 2011-08-21 DIAGNOSIS — Z923 Personal history of irradiation: Secondary | ICD-10-CM | POA: Insufficient documentation

## 2011-08-21 DIAGNOSIS — Z9221 Personal history of antineoplastic chemotherapy: Secondary | ICD-10-CM | POA: Insufficient documentation

## 2011-08-21 DIAGNOSIS — C8190 Hodgkin lymphoma, unspecified, unspecified site: Secondary | ICD-10-CM

## 2011-08-21 LAB — CBC WITH DIFFERENTIAL/PLATELET
Eosinophils Absolute: 0.1 10*3/uL (ref 0.0–0.5)
LYMPH%: 22.6 % (ref 14.0–49.7)
MCH: 32 pg (ref 25.1–34.0)
MCHC: 33.6 g/dL (ref 31.5–36.0)
MCV: 95.5 fL (ref 79.5–101.0)
MONO%: 7.5 % (ref 0.0–14.0)
Platelets: 191 10*3/uL (ref 145–400)
RBC: 3.93 10*6/uL (ref 3.70–5.45)

## 2011-08-21 LAB — CMP (CANCER CENTER ONLY)
Alkaline Phosphatase: 53 U/L (ref 26–84)
Glucose, Bld: 96 mg/dL (ref 73–118)
Sodium: 138 mEq/L (ref 128–145)
Total Bilirubin: 0.8 mg/dl (ref 0.20–1.60)
Total Protein: 7.4 g/dL (ref 6.4–8.1)

## 2011-08-21 MED ORDER — IOHEXOL 300 MG/ML  SOLN
100.0000 mL | Freq: Once | INTRAMUSCULAR | Status: AC | PRN
  Administered 2011-08-21: 100 mL via INTRAVENOUS

## 2011-09-02 ENCOUNTER — Telehealth: Payer: Self-pay | Admitting: Internal Medicine

## 2011-09-02 ENCOUNTER — Ambulatory Visit (HOSPITAL_BASED_OUTPATIENT_CLINIC_OR_DEPARTMENT_OTHER): Payer: BC Managed Care – PPO | Admitting: Internal Medicine

## 2011-09-02 VITALS — BP 111/76 | HR 59 | Temp 98.2°F | Ht 67.0 in | Wt 151.3 lb

## 2011-09-02 DIAGNOSIS — Z8571 Personal history of Hodgkin lymphoma: Secondary | ICD-10-CM

## 2011-09-02 DIAGNOSIS — Z9221 Personal history of antineoplastic chemotherapy: Secondary | ICD-10-CM

## 2011-09-02 DIAGNOSIS — C8190 Hodgkin lymphoma, unspecified, unspecified site: Secondary | ICD-10-CM

## 2011-09-02 NOTE — Progress Notes (Signed)
[facility] OFFICE PROGRESS NOTE  DIAGNOSIS: Stage III Hodgkin lymphoma diagnosed in 08/2008.  PRIOR THERAPY:  1. Status post 4 cycles of systemic chemotherapy with ABVD (Adriamycin, bleomycin, Velban, and dacarbazine).  Last dose was given 01/27/2009. 2. Status post involved field radiotherapy under the care of Dr. Michell Heinrich.  The patient received a total dose of 30.6 Gy between 02/21/2009 through 03/15/2009.  CURRENT THERAPY: Observation.  INTERVAL HISTORY: Brittany Harrington 40 y.o. female returns to the clinic today for annual followup visit. The patient is feeling fine today and she denied having any significant complaints. She has no weight loss or night sweats, no chest pain or shortness of breath, no cough or hemoptysis, no palpable lymphadenopathy. She has repeat CT scan of the neck, chest, abdomen and pelvis performed recently and she is here for evaluation and discussion of her scan results  ALLERGIES:   has no allergies on file.  MEDICATIONS:  No current outpatient prescriptions on file.    REVIEW OF SYSTEMS:  A comprehensive review of systems was negative.   PHYSICAL EXAMINATION: General appearance: alert, cooperative and no distress Head: Normocephalic, without obvious abnormality, atraumatic Neck: no adenopathy Lymph nodes: Cervical, supraclavicular, and axillary nodes normal. Resp: clear to auscultation bilaterally Cardio: regular rate and rhythm, S1, S2 normal, no murmur, click, rub or gallop GI: soft, non-tender; bowel sounds normal; no masses,  no organomegaly Extremities: extremities normal, atraumatic, no cyanosis or edema Neurologic: Alert and oriented X 3, normal strength and tone. Normal symmetric reflexes. Normal coordination and gait  ECOG PERFORMANCE STATUS: 0 - Asymptomatic  There were no vitals taken for this visit.  LABORATORY DATA: Lab Results  Component Value Date   WBC 4.4 08/21/2011   HGB 12.6 08/21/2011   HCT 37.5 08/21/2011   MCV  95.5 08/21/2011   PLT 191 08/21/2011      Chemistry      Component Value Date/Time   NA 138 08/21/2011 1022   NA 141 08/28/2010 1021   K 4.3 08/21/2011 1022   K 4.1 08/28/2010 1021   CL 101 08/21/2011 1022   CL 107 08/28/2010 1021   CO2 29 08/21/2011 1022   CO2 26 08/28/2010 1021   BUN 10 08/21/2011 1022   BUN 12 08/28/2010 1021   CREATININE 0.8 08/21/2011 1022   CREATININE 0.90 08/28/2010 1021      Component Value Date/Time   CALCIUM 8.9 08/21/2011 1022   CALCIUM 9.5 08/28/2010 1021   ALKPHOS 53 08/21/2011 1022   ALKPHOS 36* 08/28/2010 1021   AST 24 08/21/2011 1022   AST 18 08/28/2010 1021   ALT 12 08/28/2010 1021   BILITOT 0.80 08/21/2011 1022   BILITOT 1.0 08/28/2010 1021       RADIOGRAPHIC STUDIES: Ct Soft Tissue Neck W Contrast  08/21/2011  *RADIOLOGY REPORT*  Clinical Data:  Hodgkin's lymphoma.  Chemotherapy and radiation therapy complete.  CT NECK, CHEST, ABDOMEN AND PELVIS WITH CONTRAST  Technique:  Multidetector CT imaging of the neck, chest, abdomen and pelvis was performed using the standard protocol following the bolus administration of intravenous contrast.  Contrast: OMNIPAQUE IOHEXOL 300 MG/ML IV SOLN  Comparison:  The CT 08/28/2010    CT NECK  Findings:  No cervical lymphadenopathy.  Small level II lymph nodes are less than 10 mm short axis and unchanged from prior.  No submental adenopathy.  The pharyngeal mucosa is  normal.  The salivary glands appear normal.  Limited view of the inferior brain and orbits appear normal.  No osseous abnormality.  IMPRESSION: No lymphoma recurrence.    CT CHEST  Findings: No axillary or supraclavicular lymphadenopathy.  No mediastinal or hilar lymphadenopathy.  No pericardial fluid.  No suspicious pulmonary nodules.  There is mild linear reticular pattern within the left lower lobe posteriorly which is increased slightly compared to prior.  There is some mucous plugging in the lower lobe bronchus on the left (image 45).  This  findings, although worsened, have an inflammatory infectious pattern.  IMPRESSION: 1.  No evidence of lymphoma recurrence. 2.  Increased reticular pattern within the left lower lobe with mucous plugging.  Findings suggest mild worsening of inflammatory or infectious process.    CT ABDOMEN AND PELVIS  Findings: No focal hepatic lesion.  The gallbladder, pancreas, spleen, adrenal glands, and kidneys are normal.  The stomach, small bowel, and cecum are normal.  There is moderate volume stool throughout the colon.  No evidence of obstructing lesion.  Abdominal aorta normal caliber.  No retroperitoneal periportal lymphadenopathy.  No free fluid the pelvis.  The bladder, uterus, ovaries are normal. No pelvic lymphadenopathy. Review of  bone windows demonstrates no aggressive osseous lesions.  IMPRESSION:  1.  No evidence of lymphoma recurrence within the abdomen or pelvis.  2.  Moderate volume stool throughout the colon.  Original Report Authenticated By: Genevive Bi, M.D.   Mm Digital Screening  08/07/2011  DG SCREEN MAMMOGRAM BILATERAL Bilateral CC and MLO view(s) were taken.  DIGITAL SCREENING MAMMOGRAM WITH CAD: The breast tissue is heterogeneously dense.  No masses or malignant type calcifications are  identified.  Compared with prior studies.  Images were processed with CAD.  IMPRESSION: No specific mammographic evidence of malignancy.  Next screening mammogram is recommended in one  year.  A result letter of this screening mammogram will be mailed directly to the patient.  ASSESSMENT: Negative - BI-RADS 1  Screening mammogram in 1 year. ,    ASSESSMENT: This is a very pleasant 40 years old [place] female with history of stage III Hodgkin's lymphoma diagnosed 3 years ago. The patient is status post systemic chemotherapy with ABVD in addition to involved field radiotherapy. She is doing fine and she has no evidence for disease recurrence. I discussed the scan results with the patient   PLAN: I  recommended for her continuous observation for now. I would see her back for followup visit in one year with repeat CT scan of the neck, chest, abdomen and pelvis. The patient had a recent mammogram and that was negative. All questions were answered. The patient knows to call the clinic with any problems, questions or concerns. We can certainly see the patient much sooner if necessary.

## 2011-09-02 NOTE — Telephone Encounter (Signed)
gve the pt her dec 2013 appt calendar along with the ct scan appt with instructions 

## 2011-10-18 ENCOUNTER — Telehealth: Payer: Self-pay | Admitting: Internal Medicine

## 2011-10-18 ENCOUNTER — Other Ambulatory Visit: Payer: Self-pay | Admitting: Internal Medicine

## 2011-10-18 NOTE — Telephone Encounter (Signed)
Requests that her  December   CT be rescheduled at [facility] instead of [facility] ( more expensive for her at [facility]).  She has contrast. I will send onc scheduling request

## 2011-10-21 ENCOUNTER — Telehealth: Payer: Self-pay | Admitting: Internal Medicine

## 2011-10-21 NOTE — Telephone Encounter (Signed)
pt had called to have 08/2012 ct appt moved to [facility],r./s and lm for pt   aom

## 2012-04-21 ENCOUNTER — Other Ambulatory Visit: Payer: Self-pay | Admitting: Internal Medicine

## 2012-04-21 DIAGNOSIS — Z1231 Encounter for screening mammogram for malignant neoplasm of breast: Secondary | ICD-10-CM

## 2012-07-23 ENCOUNTER — Telehealth: Payer: Self-pay | Admitting: *Deleted

## 2012-07-23 NOTE — Telephone Encounter (Signed)
Received note and lab work from [facility] wanting to know whether Dr Donnald Garre wants to see pt sooner due to WBC decreasing to 2.9.  Per Dr Donnald Garre, pt does not need to f/u any sooner then currently scheduled appt 12/23.  Called and informed pt, she verbalized understanding,  SLJ

## 2012-08-06 ENCOUNTER — Ambulatory Visit
Admission: RE | Admit: 2012-08-06 | Discharge: 2012-08-06 | Disposition: A | Discharge status: Home / Self Care | Payer: BC Managed Care – PPO | Source: Ambulatory Visit | Attending: Internal Medicine | Admitting: Internal Medicine

## 2012-08-06 DIAGNOSIS — Z1231 Encounter for screening mammogram for malignant neoplasm of breast: Secondary | ICD-10-CM

## 2012-08-31 ENCOUNTER — Other Ambulatory Visit: Payer: Self-pay | Admitting: Obstetrics and Gynecology

## 2012-09-01 ENCOUNTER — Ambulatory Visit
Admission: RE | Admit: 2012-09-01 | Discharge: 2012-09-01 | Disposition: A | Discharge status: Home / Self Care | Payer: BC Managed Care – PPO | Source: Ambulatory Visit | Attending: Internal Medicine | Admitting: Internal Medicine

## 2012-09-01 ENCOUNTER — Other Ambulatory Visit (HOSPITAL_BASED_OUTPATIENT_CLINIC_OR_DEPARTMENT_OTHER): Payer: BC Managed Care – PPO | Admitting: Lab

## 2012-09-01 ENCOUNTER — Other Ambulatory Visit (HOSPITAL_COMMUNITY): Payer: BC Managed Care – PPO

## 2012-09-01 DIAGNOSIS — C8190 Hodgkin lymphoma, unspecified, unspecified site: Secondary | ICD-10-CM

## 2012-09-01 LAB — COMPREHENSIVE METABOLIC PANEL (CC13)
Albumin: 3.5 g/dL (ref 3.5–5.0)
CO2: 27 mEq/L (ref 22–29)
Calcium: 9.1 mg/dL (ref 8.4–10.4)
Glucose: 83 mg/dl (ref 70–99)
Potassium: 4.7 mEq/L (ref 3.5–5.1)
Sodium: 138 mEq/L (ref 136–145)
Total Bilirubin: 1.43 mg/dL — ABNORMAL HIGH (ref 0.20–1.20)
Total Protein: 6.8 g/dL (ref 6.4–8.3)

## 2012-09-01 LAB — LACTATE DEHYDROGENASE (CC13): LDH: 177 U/L (ref 125–245)

## 2012-09-01 MED ORDER — IOHEXOL 300 MG/ML  SOLN
150.0000 mL | Freq: Once | INTRAMUSCULAR | Status: AC | PRN
  Administered 2012-09-01: 100 mL via INTRAVENOUS

## 2012-09-07 ENCOUNTER — Encounter: Payer: Self-pay | Admitting: Internal Medicine

## 2012-09-07 ENCOUNTER — Ambulatory Visit (HOSPITAL_BASED_OUTPATIENT_CLINIC_OR_DEPARTMENT_OTHER): Payer: BC Managed Care – PPO | Admitting: Internal Medicine

## 2012-09-07 ENCOUNTER — Telehealth: Payer: Self-pay | Admitting: Internal Medicine

## 2012-09-07 VITALS — BP 114/69 | HR 76 | Temp 98.9°F | Resp 20 | Ht 67.0 in | Wt 146.5 lb

## 2012-09-07 DIAGNOSIS — C8190 Hodgkin lymphoma, unspecified, unspecified site: Secondary | ICD-10-CM | POA: Insufficient documentation

## 2012-09-07 NOTE — Patient Instructions (Signed)
Your scan showed no evidence for disease recurrence. Followup in one year with repeat CT scan of the neck, chest, abdomen and pelvis.

## 2012-09-07 NOTE — Progress Notes (Signed)
[facility] Telephone:([phone]   [phone]  OFFICE PROGRESS NOTE  DIAGNOSIS: Stage III Hodgkin lymphoma diagnosed in 08/2008.   PRIOR THERAPY:  1. Status post 4 cycles of systemic chemotherapy with ABVD (Adriamycin, bleomycin, Velban, and dacarbazine). Last dose was given 01/27/2009.  2. Status post involved field radiotherapy under the care of Dr. Michell Heinrich. The patient received a total dose of 30.6 Gy between 02/21/2009 through 03/15/2009.   CURRENT THERAPY: Observation.   INTERVAL HISTORY: Brittany Harrington 41 y.o. female returns to the clinic today for annual followup visit. The patient is feeling fine today with no specific complaints. She denied having any significant chest pain, shortness breath, cough or hemoptysis. She denied having any significant weight loss or night sweats. The patient had repeat CT scan of the neck, chest, abdomen and pelvis performed recently and she is here for evaluation and discussion of her scan results.  ALLERGIES:   has no known allergies.  MEDICATIONS:  Current Outpatient Prescriptions  Medication Sig Dispense Refill  . Fexofenadine HCl (ALLEGRA PO) Take by mouth as needed.          REVIEW OF SYSTEMS:  A comprehensive review of systems was negative.   PHYSICAL EXAMINATION: General appearance: alert, cooperative and no distress Head: Normocephalic, without obvious abnormality, atraumatic Neck: no adenopathy Lymph nodes: Cervical, supraclavicular, and axillary nodes normal. Resp: clear to auscultation bilaterally Cardio: regular rate and rhythm, S1, S2 normal, no murmur, click, rub or gallop GI: soft, non-tender; bowel sounds normal; no masses,  no organomegaly Extremities: extremities normal, atraumatic, no cyanosis or edema  ECOG PERFORMANCE STATUS: 0 - Asymptomatic  Last menstrual period 08/17/2012.  LABORATORY DATA: Lab Results  Component Value Date   WBC 4.4 08/21/2011   HGB 12.6 08/21/2011   HCT 37.5  08/21/2011   MCV 95.5 08/21/2011   PLT 191 08/21/2011      Chemistry      Component Value Date/Time   NA 138 09/01/2012 0901   NA 138 08/21/2011 1022   NA 141 08/28/2010 1021   K 4.7 09/01/2012 0901   K 4.3 08/21/2011 1022   K 4.1 08/28/2010 1021   CL 103 09/01/2012 0901   CL 101 08/21/2011 1022   CL 107 08/28/2010 1021   CO2 27 09/01/2012 0901   CO2 29 08/21/2011 1022   CO2 26 08/28/2010 1021   BUN 12.0 09/01/2012 0901   BUN 10 08/21/2011 1022   BUN 12 08/28/2010 1021   CREATININE 0.7 09/01/2012 0901   CREATININE 0.8 08/21/2011 1022   CREATININE 0.90 08/28/2010 1021      Component Value Date/Time   CALCIUM 9.1 09/01/2012 0901   CALCIUM 8.9 08/21/2011 1022   CALCIUM 9.5 08/28/2010 1021   ALKPHOS 48 09/01/2012 0901   ALKPHOS 53 08/21/2011 1022   ALKPHOS 36* 08/28/2010 1021   AST 17 09/01/2012 0901   AST 24 08/21/2011 1022   AST 18 08/28/2010 1021   ALT 10 09/01/2012 0901   ALT 12 08/28/2010 1021   BILITOT 1.43* 09/01/2012 0901   BILITOT 0.80 08/21/2011 1022   BILITOT 1.0 08/28/2010 1021       RADIOGRAPHIC STUDIES: Ct Soft Tissue Neck W Contrast  09/01/2012  *RADIOLOGY REPORT*  Clinical Data: History of stage III Hodgkin's lymphoma.  Status post systemic chemotherapy and local field radiotherapy.  Yearly follow-up.  CT NECK WITH CONTRAST  Technique:  Multidetector CT imaging of the neck was performed with intravenous contrast.  Contrast: OMNIPAQUE  IOHEXOL 300 MG/ML  SOLN  Comparison: Most recent 08/21/2011  Findings: Suprahyoid neck:  No mucosal lesions.  Unremarkable major and minor salivary glands.  No significant sinus or mastoid disease.  Larynx:  Normal.  Infrahyoid neck:  Normal.  Lymph nodes:  No pathologic lymphadenopathy.  Stable bilateral level II lymph nodes, none of which display short axis greater than 10 mm.  Upper chest/mediastinum:  Please see chest CT dictation.  Additional:  Mild cervical spondylosis.  Craniocervical vasculature widely patent.  Visualized  intracranial compartment negative.  IMPRESSION: Stable post treatment appearance Hodgkin's lymphoma.  No evidence for recurrence.   Original Report Authenticated By: Davonna Belling, M.D.    Ct Chest W Contrast  09/01/2012  *RADIOLOGY REPORT*  Clinical Data:  Restaging of Hodgkin's lymphoma.  CT CHEST, ABDOMEN AND PELVIS WITH CONTRAST  Technique: Contiguous axial images of the chest abdomen and pelvis were obtained after IV contrast administration.  Contrast: 100  ml Omnipaque-300  Comparison: 08/21/2011. Today's neck CT, dictated separately.   CT CHEST  Findings: Lung windows demonstrate secretions within the dependent trachea. 3 mm left upper lobe nodule on image 19/series 4 is unchanged and may represent a subpleural lymph node. There is also mild nodularity along the left major fissure which is similar and may represent a subpleural lymph node.  Image 31/series 4.  Minimal increase in interstitial opacity in the medial left lung base with mild  bronchiectasis and mucous plugging within bronchi. Example image 46/series 4.  Soft tissue windows demonstrate no axillary adenopathy. Normal heart size without pericardial or pleural effusion.  No central pulmonary embolism, on this non-dedicated study.  No mediastinal or hilar adenopathy.  IMPRESSION:  1. No acute process or evidence of active lymphoma within the chest. 2.  Further progression of left lower lobe infectious/inflammatory process.   CT ABDOMEN AND PELVIS  Findings:  Normal liver, spleen.  Underdistended proximal stomach. Normal pancreas, gallbladder, biliary tract, adrenal glands, kidneys. No retroperitoneal or retrocrural adenopathy.  Normal colon and terminal ileum.  Normal small bowel without abdominal ascites.    No pelvic adenopathy.  Normal urinary bladder and uterus.  Right ovarian dominant follicle. No significant free fluid.  No acute osseous abnormality.  Mild disc bulge at the lumbosacral junction.  IMPRESSION: No acute process or evidence of  active lymphoma within the abdomen or pelvis.   Original Report Authenticated By: Jeronimo Greaves, M.D.     ASSESSMENT: This is a very pleasant 41 years old white female with history of stage III Hodgkin lymphoma diagnosed in December of 2009 status post systemic chemotherapy followed by involved field radiotherapy and the patient has been observation since June of 2010 with no evidence for disease recurrence.   PLAN: I discussed the scan results with the patient today. I recommended for her to continue on observation for now with repeat CT scan of the neck, chest, abdomen and pelvis in one year. The patient would come back for followup visit at that time. She was advised to call me immediately if she has any concerning symptoms in the interval.  All questions were answered. The patient knows to call the clinic with any problems, questions or concerns. We can certainly see the patient much sooner if necessary.

## 2012-09-07 NOTE — Telephone Encounter (Signed)
gv pt appt schedule for December 2014 including ct and prep for ct appt 09/02/13 @ [facility] ([address]). Pt requested [facility].

## 2013-07-23 ENCOUNTER — Other Ambulatory Visit: Payer: Self-pay

## 2013-07-23 DIAGNOSIS — Z1231 Encounter for screening mammogram for malignant neoplasm of breast: Secondary | ICD-10-CM

## 2013-07-24 ENCOUNTER — Other Ambulatory Visit: Payer: Self-pay | Admitting: Internal Medicine

## 2013-07-24 DIAGNOSIS — C8190 Hodgkin lymphoma, unspecified, unspecified site: Secondary | ICD-10-CM

## 2013-07-26 ENCOUNTER — Other Ambulatory Visit: Payer: Self-pay | Admitting: *Deleted

## 2013-07-26 ENCOUNTER — Telehealth: Payer: Self-pay | Admitting: Internal Medicine

## 2013-07-26 NOTE — Telephone Encounter (Signed)
Pt moved appt to January 2015 ok with Dr.Mohamed, Pt aware of new date

## 2013-08-13 ENCOUNTER — Ambulatory Visit: Payer: BC Managed Care – PPO

## 2013-09-01 ENCOUNTER — Ambulatory Visit
Admission: RE | Admit: 2013-09-01 | Discharge: 2013-09-01 | Disposition: A | Discharge status: Home / Self Care | Payer: Self-pay | Source: Ambulatory Visit

## 2013-09-01 DIAGNOSIS — Z1231 Encounter for screening mammogram for malignant neoplasm of breast: Secondary | ICD-10-CM

## 2013-09-02 ENCOUNTER — Other Ambulatory Visit: Payer: BC Managed Care – PPO | Admitting: Lab

## 2013-09-02 ENCOUNTER — Other Ambulatory Visit: Payer: BC Managed Care – PPO

## 2013-09-06 ENCOUNTER — Ambulatory Visit: Payer: BC Managed Care – PPO | Admitting: Internal Medicine

## 2013-09-20 ENCOUNTER — Ambulatory Visit: Payer: BC Managed Care – PPO

## 2013-09-20 ENCOUNTER — Encounter: Payer: Self-pay | Admitting: Internal Medicine

## 2013-09-20 ENCOUNTER — Other Ambulatory Visit (HOSPITAL_BASED_OUTPATIENT_CLINIC_OR_DEPARTMENT_OTHER): Payer: Managed Care, Other (non HMO)

## 2013-09-20 ENCOUNTER — Other Ambulatory Visit: Payer: Managed Care, Other (non HMO)

## 2013-09-20 ENCOUNTER — Telehealth: Payer: Self-pay | Admitting: Internal Medicine

## 2013-09-20 DIAGNOSIS — C8190 Hodgkin lymphoma, unspecified, unspecified site: Secondary | ICD-10-CM

## 2013-09-20 LAB — COMPREHENSIVE METABOLIC PANEL (CC13)
ALBUMIN: 3.8 g/dL (ref 3.5–5.0)
ALT: 13 U/L (ref 0–55)
AST: 16 U/L (ref 5–34)
Alkaline Phosphatase: 44 U/L (ref 40–150)
Anion Gap: 8 mEq/L (ref 3–11)
BUN: 16.9 mg/dL (ref 7.0–26.0)
CALCIUM: 9.2 mg/dL (ref 8.4–10.4)
CHLORIDE: 106 meq/L (ref 98–109)
CO2: 24 mEq/L (ref 22–29)
Creatinine: 0.7 mg/dL (ref 0.6–1.1)
GLUCOSE: 91 mg/dL (ref 70–140)
POTASSIUM: 4.4 meq/L (ref 3.5–5.1)
Sodium: 138 mEq/L (ref 136–145)
Total Bilirubin: 0.91 mg/dL (ref 0.20–1.20)
Total Protein: 7.1 g/dL (ref 6.4–8.3)

## 2013-09-20 LAB — CBC WITH DIFFERENTIAL/PLATELET
BASO%: 0.8 % (ref 0.0–2.0)
BASOS ABS: 0 10*3/uL (ref 0.0–0.1)
EOS ABS: 0.1 10*3/uL (ref 0.0–0.5)
EOS%: 1.3 % (ref 0.0–7.0)
HCT: 37.7 % (ref 34.8–46.6)
HEMOGLOBIN: 12.8 g/dL (ref 11.6–15.9)
LYMPH#: 1.3 10*3/uL (ref 0.9–3.3)
LYMPH%: 24.8 % (ref 14.0–49.7)
MCH: 33.2 pg (ref 25.1–34.0)
MCHC: 33.9 g/dL (ref 31.5–36.0)
MCV: 97.8 fL (ref 79.5–101.0)
MONO#: 0.6 10*3/uL (ref 0.1–0.9)
MONO%: 12.1 % (ref 0.0–14.0)
NEUT%: 61 % (ref 38.4–76.8)
NEUTROS ABS: 3.2 10*3/uL (ref 1.5–6.5)
Platelets: 197 10*3/uL (ref 145–400)
RBC: 3.86 10*6/uL (ref 3.70–5.45)
RDW: 13.6 % (ref 11.2–14.5)
WBC: 5.2 10*3/uL (ref 3.9–10.3)

## 2013-09-20 NOTE — Progress Notes (Signed)
[facility] DENIED HER CT OF THE NECK [address]1595 AS SHE HAS BEEN OFF TREATMENT FOR OVER 3 YEARS. I GAVE THE DENIAL TO DIANE BELL.

## 2013-09-20 NOTE — Telephone Encounter (Signed)
Pt called and r/s MD vsiit to 1/20 due to insurance,nurse notified

## 2013-09-21 ENCOUNTER — Ambulatory Visit: Payer: BC Managed Care – PPO | Admitting: Internal Medicine

## 2013-09-23 ENCOUNTER — Other Ambulatory Visit: Payer: Managed Care, Other (non HMO)

## 2013-09-23 NOTE — Progress Notes (Addendum)
Per Dr Vista Mink, okay to cancel CT of the neck, information given to [facility].  Per Benedetto Goad, there has been an insurance change, further evaluation is needed for the case.  She will keep [place] posted on the status.  SLJ

## 2013-09-23 NOTE — Progress Notes (Signed)
Per Benedetto Goad, Ct neck, CAP has been approved.  SLJ

## 2013-09-28 ENCOUNTER — Telehealth: Payer: Self-pay | Admitting: Internal Medicine

## 2013-09-28 NOTE — Telephone Encounter (Signed)
s.w. pt and r/s est to another day

## 2013-09-29 ENCOUNTER — Other Ambulatory Visit: Payer: Managed Care, Other (non HMO)

## 2013-10-04 ENCOUNTER — Ambulatory Visit
Admission: RE | Admit: 2013-10-04 | Discharge: 2013-10-04 | Disposition: A | Discharge status: Home / Self Care | Payer: Managed Care, Other (non HMO) | Source: Ambulatory Visit | Attending: Internal Medicine | Admitting: Internal Medicine

## 2013-10-04 DIAGNOSIS — C8190 Hodgkin lymphoma, unspecified, unspecified site: Secondary | ICD-10-CM

## 2013-10-04 MED ORDER — IOHEXOL 300 MG/ML  SOLN
100.0000 mL | Freq: Once | INTRAMUSCULAR | Status: AC | PRN
  Administered 2013-10-04: 100 mL via INTRAVENOUS

## 2013-10-05 ENCOUNTER — Ambulatory Visit: Payer: Managed Care, Other (non HMO) | Admitting: Internal Medicine

## 2013-10-07 ENCOUNTER — Encounter: Payer: Self-pay | Admitting: Internal Medicine

## 2013-10-07 ENCOUNTER — Ambulatory Visit (HOSPITAL_BASED_OUTPATIENT_CLINIC_OR_DEPARTMENT_OTHER): Payer: Managed Care, Other (non HMO) | Admitting: Internal Medicine

## 2013-10-07 ENCOUNTER — Encounter (INDEPENDENT_AMBULATORY_CARE_PROVIDER_SITE_OTHER): Payer: Self-pay

## 2013-10-07 ENCOUNTER — Telehealth: Payer: Self-pay | Admitting: Internal Medicine

## 2013-10-07 VITALS — BP 104/54 | HR 57 | Temp 98.3°F | Resp 20 | Ht 67.0 in | Wt 143.0 lb

## 2013-10-07 DIAGNOSIS — C8190 Hodgkin lymphoma, unspecified, unspecified site: Secondary | ICD-10-CM

## 2013-10-07 NOTE — Progress Notes (Signed)
[place] [phone]   Fax:([phone]  OFFICE PROGRESS NOTE  DIAGNOSIS: Stage III Hodgkin lymphoma diagnosed in 08/2008.   PRIOR THERAPY:  1. Status post 4 cycles of systemic chemotherapy with ABVD (Adriamycin, bleomycin, Velban, and dacarbazine). Last dose was given 01/27/2009.  2. Status post involved field radiotherapy under the care of Dr. Pablo Ledger. The patient received a total dose of 30.6 Gy between 02/21/2009 through 03/15/2009.   CURRENT THERAPY: Observation.   INTERVAL HISTORY: Brittany Harrington 43 y.o. female returns to the clinic today for annual followup visit. The patient is feeling fine today with no specific complaints. She denied having any significant chest pain, shortness breath, cough or hemoptysis. She denied having any significant weight loss or night sweats. The patient had repeat CT scan of the neck, chest, abdomen and pelvis performed recently and she is here for evaluation and discussion of her scan results.  ALLERGIES:  has No Known Allergies.  MEDICATIONS:  Current Outpatient Prescriptions  Medication Sig Dispense Refill  . Fexofenadine HCl (ALLEGRA PO) Take by mouth as needed.         No current facility-administered medications for this visit.    REVIEW OF SYSTEMS:  A comprehensive review of systems was negative.   PHYSICAL EXAMINATION: General appearance: alert, cooperative and no distress Head: Normocephalic, without obvious abnormality, atraumatic Neck: no adenopathy Lymph nodes: Cervical, supraclavicular, and axillary nodes normal. Resp: clear to auscultation bilaterally Cardio: regular rate and rhythm, S1, S2 normal, no murmur, click, rub or gallop GI: soft, non-tender; bowel sounds normal; no masses,  no organomegaly Extremities: extremities normal, atraumatic, no cyanosis or edema  ECOG PERFORMANCE STATUS: 0 - Asymptomatic  Blood pressure 104/54, pulse 57, temperature 98.3 F (36.8 C), temperature  source Oral, resp. rate 20, height 5\' 7"  (1.702 m), weight 143 lb (64.864 kg), last menstrual period 09/29/2013, SpO2 100.00%.  LABORATORY DATA: Lab Results  Component Value Date   WBC 5.2 09/20/2013   HGB 12.8 09/20/2013   HCT 37.7 09/20/2013   MCV 97.8 09/20/2013   PLT 197 09/20/2013      Chemistry      Component Value Date/Time   NA 138 09/20/2013 0942   NA 138 08/21/2011 1022   NA 141 08/28/2010 1021   K 4.4 09/20/2013 0942   K 4.3 08/21/2011 1022   K 4.1 08/28/2010 1021   CL 103 09/01/2012 0901   CL 101 08/21/2011 1022   CL 107 08/28/2010 1021   CO2 24 09/20/2013 0942   CO2 29 08/21/2011 1022   CO2 26 08/28/2010 1021   BUN 16.9 09/20/2013 0942   BUN 10 08/21/2011 1022   BUN 12 08/28/2010 1021   CREATININE 0.7 09/20/2013 0942   CREATININE 0.8 08/21/2011 1022   CREATININE 0.90 08/28/2010 1021      Component Value Date/Time   CALCIUM 9.2 09/20/2013 0942   CALCIUM 8.9 08/21/2011 1022   CALCIUM 9.5 08/28/2010 1021   ALKPHOS 44 09/20/2013 0942   ALKPHOS 53 08/21/2011 1022   ALKPHOS 36* 08/28/2010 1021   AST 16 09/20/2013 0942   AST 24 08/21/2011 1022   AST 18 08/28/2010 1021   ALT 13 09/20/2013 0942   ALT 19 08/21/2011 1022   ALT 12 08/28/2010 1021   BILITOT 0.91 09/20/2013 0942   BILITOT 0.80 08/21/2011 1022   BILITOT 1.0 08/28/2010 1021       RADIOGRAPHIC STUDIES:  Ct Soft Tissue Neck W Contrast  10/04/2013   CLINICAL DATA:  Staging Hodgkin's lymphoma.  EXAM: CT NECK WITH CONTRAST  TECHNIQUE: Multidetector CT imaging of the neck was performed using the standard protocol following the bolus administration of intravenous contrast.  CONTRAST:  183mL OMNIPAQUE IOHEXOL 300 MG/ML  SOLN  COMPARISON:  09/01/2012.  FINDINGS: Suprahyoid neck:  Normal.  Larynx:  Normal.  Infrahyoid neck:  Normal.  Lymph nodes:  No pathologic lymphadenopathy.  Upper chest/mediastinum:  Dictated separately, see CT chest report.  Additional: Craniocervical vasculature widely patent. No worrisome osseous lesions. Mild cervical  spondylosis C5-C6. Intracranial compartment unremarkable.  IMPRESSION: Negative exam.  No evidence for Hodgkin's disease recurrence.   Electronically Signed   By: Rolla Flatten M.D.   On: 10/04/2013 16:37   Ct Chest W Contrast  10/04/2013   CLINICAL DATA:  Hodgkin lymphoma.  EXAM: CT CHEST, ABDOMEN, AND PELVIS WITH CONTRAST  TECHNIQUE: Multidetector CT imaging of the chest, abdomen and pelvis was performed following the standard protocol during bolus administration of intravenous contrast.  CONTRAST:  130mL OMNIPAQUE IOHEXOL 300 MG/ML  SOLN  COMPARISON:  CT ABD/PELVIS W CM dated 09/01/2012  FINDINGS: CT CHEST FINDINGS  No pathologically enlarged mediastinal, hilar or axillary lymph nodes. Heart size normal. No pericardial effusion.  Minimal dependent atelectasis in the right lower lobe. Peribronchovascular nodularity and bronchial wall thickening in the of left lower lobe appear chronic, indicative of scarring. A few scattered pulmonary nodules are again seen, measuring 4 mm or less in size, unchanged from 09/01/2012 and therefore considered benign. No pleural fluid. Airway is unremarkable.  CT ABDOMEN AND PELVIS FINDINGS  Liver, gallbladder, adrenal glands, kidneys, spleen, pancreas, stomach and small bowel are unremarkable. A fair amount of stool is seen in the colon, indicating constipation. Uterus and ovaries are visualized. No pathologically enlarged lymph nodes. No free fluid. No worrisome lytic or sclerotic lesions.  IMPRESSION: 1. No evidence of recurrent lymphoma. 2. CT neck is dictated separately.   Electronically Signed   By: Lorin Picket M.D.   On: 10/04/2013 14:46   Ct Abdomen Pelvis W Contrast  10/04/2013   CLINICAL DATA:  Hodgkin lymphoma.  EXAM: CT CHEST, ABDOMEN, AND PELVIS WITH CONTRAST  TECHNIQUE: Multidetector CT imaging of the chest, abdomen and pelvis was performed following the standard protocol during bolus administration of intravenous contrast.  CONTRAST:  147mL OMNIPAQUE IOHEXOL  300 MG/ML  SOLN  COMPARISON:  CT ABD/PELVIS W CM dated 09/01/2012  FINDINGS: CT CHEST FINDINGS  No pathologically enlarged mediastinal, hilar or axillary lymph nodes. Heart size normal. No pericardial effusion.  Minimal dependent atelectasis in the right lower lobe. Peribronchovascular nodularity and bronchial wall thickening in the of left lower lobe appear chronic, indicative of scarring. A few scattered pulmonary nodules are again seen, measuring 4 mm or less in size, unchanged from 09/01/2012 and therefore considered benign. No pleural fluid. Airway is unremarkable.  CT ABDOMEN AND PELVIS FINDINGS  Liver, gallbladder, adrenal glands, kidneys, spleen, pancreas, stomach and small bowel are unremarkable. A fair amount of stool is seen in the colon, indicating constipation. Uterus and ovaries are visualized. No pathologically enlarged lymph nodes. No free fluid. No worrisome lytic or sclerotic lesions.  IMPRESSION: 1. No evidence of recurrent lymphoma. 2. CT neck is dictated separately.   Electronically Signed   By: Lorin Picket M.D.   On: 10/04/2013 14:46   ASSESSMENT AND PLAN: This is a very pleasant 43 years old white female with history of stage III Hodgkin lymphoma diagnosed in December of 2009 status post systemic chemotherapy followed by  involved field radiotherapy and the patient has been observation since June of 2010 with no evidence for disease recurrence. She also had a mammogram in December of 2014 that showed no evidence for malignancy.  I discussed the scan results with the patient today. I recommended for her to continue on observation for now with follow up visit and repeat CT scan of the neck, chest, abdomen and pelvis in one year. She was advised to call me immediately if she has any concerning symptoms in the interval.  All questions were answered. The patient knows to call the clinic with any problems, questions or concerns. We can certainly see the patient much sooner if  necessary.  Disclaimer: This note was dictated with voice recognition software. Similar sounding words can inadvertently be transcribed and may not be corrected upon review.

## 2013-10-07 NOTE — Telephone Encounter (Signed)
gv adn pritned appt sched and avs for Jan 2016...gv pt barium

## 2013-10-08 NOTE — Patient Instructions (Signed)
Follow up visit in one year with repeat CT scan of the neck, chest, abdomen and pelvis .

## 2013-11-03 ENCOUNTER — Other Ambulatory Visit: Payer: Managed Care, Other (non HMO)

## 2013-11-10 ENCOUNTER — Ambulatory Visit: Payer: Managed Care, Other (non HMO) | Admitting: Internal Medicine

## 2014-07-22 ENCOUNTER — Other Ambulatory Visit: Payer: Self-pay

## 2014-07-22 DIAGNOSIS — Z1231 Encounter for screening mammogram for malignant neoplasm of breast: Secondary | ICD-10-CM

## 2014-08-15 ENCOUNTER — Other Ambulatory Visit: Payer: Self-pay | Admitting: Obstetrics and Gynecology

## 2014-08-16 LAB — CYTOLOGY - PAP

## 2014-09-19 ENCOUNTER — Ambulatory Visit
Admission: RE | Admit: 2014-09-19 | Discharge: 2014-09-19 | Disposition: A | Discharge status: Home / Self Care | Payer: Managed Care, Other (non HMO) | Source: Ambulatory Visit

## 2014-09-19 ENCOUNTER — Encounter (INDEPENDENT_AMBULATORY_CARE_PROVIDER_SITE_OTHER): Payer: Self-pay

## 2014-09-19 DIAGNOSIS — Z1231 Encounter for screening mammogram for malignant neoplasm of breast: Secondary | ICD-10-CM

## 2014-09-20 ENCOUNTER — Other Ambulatory Visit: Payer: Self-pay | Admitting: *Deleted

## 2014-09-20 ENCOUNTER — Telehealth: Payer: Self-pay | Admitting: Internal Medicine

## 2014-09-20 DIAGNOSIS — C8190 Hodgkin lymphoma, unspecified, unspecified site: Secondary | ICD-10-CM

## 2014-09-20 NOTE — Telephone Encounter (Signed)
returned pt call and r/s lab...advised pt to r/s scan with GI

## 2014-09-23 ENCOUNTER — Telehealth: Payer: Self-pay | Admitting: *Deleted

## 2014-09-23 NOTE — Telephone Encounter (Signed)
Insurance will not approve CT scans.  It is not indicated to have any further CT scans according to her insurance company.  Dr Vista Mink states that it is ok for pt to not have CT scan and she can just have lab work and f/u on 1/25.  Informed pt, she verbalized understanding.  Onc tx schedule filled out to cancel lab and CT on 1/21 and schedule lab on 10/10/14.

## 2014-10-03 ENCOUNTER — Other Ambulatory Visit: Payer: Managed Care, Other (non HMO)

## 2014-10-06 ENCOUNTER — Other Ambulatory Visit: Payer: Managed Care, Other (non HMO)

## 2014-10-10 ENCOUNTER — Encounter: Payer: Self-pay | Admitting: Internal Medicine

## 2014-10-10 ENCOUNTER — Other Ambulatory Visit (HOSPITAL_BASED_OUTPATIENT_CLINIC_OR_DEPARTMENT_OTHER): Payer: Managed Care, Other (non HMO)

## 2014-10-10 ENCOUNTER — Ambulatory Visit (HOSPITAL_BASED_OUTPATIENT_CLINIC_OR_DEPARTMENT_OTHER): Payer: Managed Care, Other (non HMO) | Admitting: Internal Medicine

## 2014-10-10 ENCOUNTER — Telehealth: Payer: Self-pay | Admitting: Internal Medicine

## 2014-10-10 VITALS — BP 111/65 | HR 63 | Temp 98.9°F | Resp 18 | Ht 67.0 in | Wt 148.5 lb

## 2014-10-10 DIAGNOSIS — C8190 Hodgkin lymphoma, unspecified, unspecified site: Secondary | ICD-10-CM

## 2014-10-10 DIAGNOSIS — Z8571 Personal history of Hodgkin lymphoma: Secondary | ICD-10-CM

## 2014-10-10 LAB — CBC WITH DIFFERENTIAL/PLATELET
BASO%: 0.7 % (ref 0.0–2.0)
Basophils Absolute: 0 10*3/uL (ref 0.0–0.1)
EOS ABS: 0.1 10*3/uL (ref 0.0–0.5)
EOS%: 1.7 % (ref 0.0–7.0)
HEMATOCRIT: 38.2 % (ref 34.8–46.6)
HGB: 12 g/dL (ref 11.6–15.9)
LYMPH%: 34.6 % (ref 14.0–49.7)
MCH: 30.9 pg (ref 25.1–34.0)
MCHC: 31.5 g/dL (ref 31.5–36.0)
MCV: 98.1 fL (ref 79.5–101.0)
MONO#: 0.4 10*3/uL (ref 0.1–0.9)
MONO%: 12.2 % (ref 0.0–14.0)
NEUT%: 50.8 % (ref 38.4–76.8)
NEUTROS ABS: 1.6 10*3/uL (ref 1.5–6.5)
Platelets: 204 10*3/uL (ref 145–400)
RBC: 3.9 10*6/uL (ref 3.70–5.45)
RDW: 13 % (ref 11.2–14.5)
WBC: 3.1 10*3/uL — AB (ref 3.9–10.3)
lymph#: 1.1 10*3/uL (ref 0.9–3.3)

## 2014-10-10 LAB — COMPREHENSIVE METABOLIC PANEL (CC13)
ALBUMIN: 3.7 g/dL (ref 3.5–5.0)
ALK PHOS: 41 U/L (ref 40–150)
ALT: 11 U/L (ref 0–55)
AST: 18 U/L (ref 5–34)
Anion Gap: 7 mEq/L (ref 3–11)
BILIRUBIN TOTAL: 0.63 mg/dL (ref 0.20–1.20)
BUN: 11.7 mg/dL (ref 7.0–26.0)
CALCIUM: 8.4 mg/dL (ref 8.4–10.4)
CO2: 27 mEq/L (ref 22–29)
Chloride: 108 mEq/L (ref 98–109)
Creatinine: 0.8 mg/dL (ref 0.6–1.1)
EGFR: >90 mL/min/{1.73_m2} (ref >90)
GLUCOSE: 92 mg/dL (ref 70–140)
Potassium: 4.3 mEq/L (ref 3.5–5.1)
Sodium: 142 mEq/L (ref 136–145)
TOTAL PROTEIN: 6.5 g/dL (ref 6.4–8.3)

## 2014-10-10 LAB — LACTATE DEHYDROGENASE (CC13): LDH: 137 U/L (ref 125–245)

## 2014-10-10 NOTE — Progress Notes (Signed)
[place] Telephone:([phone]   Fax:([phone]  OFFICE PROGRESS NOTE  DIAGNOSIS: Stage III Hodgkin lymphoma diagnosed in 08/2008.   PRIOR THERAPY:  1. Status post 4 cycles of systemic chemotherapy with ABVD (Adriamycin, bleomycin, Velban, and dacarbazine). Last dose was given 01/27/2009.  2. Status post involved field radiotherapy under the care of Dr. Pablo Ledger. The patient received a total dose of 30.6 Gy between 02/21/2009 through 03/15/2009.   CURRENT THERAPY: Observation.  INTERVAL HISTORY: Brittany Harrington 44 y.o. female returns to the clinic today for annual followup visit. The patient is feeling fine today with no specific complaints. She denied having any significant chest pain, shortness of breath, cough or hemoptysis. She denied having any significant weight loss or night sweats. She has no nausea or vomiting, no fever or chills. She had annual mammogram at regular basis. The patient had repeat CBC, comprehensive metabolic panel and LDH done earlier today and she is here for evaluation and discussion of her lab results.  ALLERGIES:  has No Known Allergies.  MEDICATIONS:  No current outpatient prescriptions on file.   No current facility-administered medications for this visit.    REVIEW OF SYSTEMS:  A comprehensive review of systems was negative.   PHYSICAL EXAMINATION: General appearance: alert, cooperative and no distress Head: Normocephalic, without obvious abnormality, atraumatic Neck: no adenopathy Lymph nodes: Cervical, supraclavicular, and axillary nodes normal. Resp: clear to auscultation bilaterally Cardio: regular rate and rhythm, S1, S2 normal, no murmur, click, rub or gallop GI: soft, non-tender; bowel sounds normal; no masses,  no organomegaly Extremities: extremities normal, atraumatic, no cyanosis or edema  ECOG PERFORMANCE STATUS: 0 - Asymptomatic  Blood pressure 111/65, pulse 63, temperature 98.9 F (37.2 C), temperature  source Oral, resp. rate 18, height 5\' 7"  (1.702 m), weight 148 lb 8 oz (67.359 kg), SpO2 99 %.  LABORATORY DATA: Lab Results  Component Value Date   WBC 3.1* 10/10/2014   HGB 12.0 10/10/2014   HCT 38.2 10/10/2014   MCV 98.1 10/10/2014   PLT 204 10/10/2014      Chemistry      Component Value Date/Time   NA 142 10/10/2014 0908   NA 138 08/21/2011 1022   NA 141 08/28/2010 1021   K 4.3 10/10/2014 0908   K 4.3 08/21/2011 1022   K 4.1 08/28/2010 1021   CL 103 09/01/2012 0901   CL 101 08/21/2011 1022   CL 107 08/28/2010 1021   CO2 27 10/10/2014 0908   CO2 29 08/21/2011 1022   CO2 26 08/28/2010 1021   BUN 11.7 10/10/2014 0908   BUN 10 08/21/2011 1022   BUN 12 08/28/2010 1021   CREATININE 0.8 10/10/2014 0908   CREATININE 0.8 08/21/2011 1022   CREATININE 0.90 08/28/2010 1021      Component Value Date/Time   CALCIUM 9.2 09/20/2013 0942   CALCIUM 8.9 08/21/2011 1022   CALCIUM 9.5 08/28/2010 1021   ALKPHOS 44 09/20/2013 0942   ALKPHOS 53 08/21/2011 1022   ALKPHOS 36* 08/28/2010 1021   AST 16 09/20/2013 0942   AST 24 08/21/2011 1022   AST 18 08/28/2010 1021   ALT 13 09/20/2013 0942   ALT 19 08/21/2011 1022   ALT 12 08/28/2010 1021   BILITOT 0.91 09/20/2013 0942   BILITOT 0.80 08/21/2011 1022   BILITOT 1.0 08/28/2010 1021       RADIOGRAPHIC STUDIES:  ASSESSMENT AND PLAN: This is a very pleasant 44 years old white female with history of stage  III Hodgkin lymphoma diagnosed in December of 2009 status post systemic chemotherapy followed by involved field radiotherapy and the patient has been observation since June of 2010 with no evidence for disease recurrence. She also had a mammogram in January 2016 that showed no evidence for malignancy. Her blood work today is unremarkable except for mild leukocytopenia which we will monitor closely.  I recommended for her to continue on observation for now with follow up visit in one year with repeat blood work. She was advised to call  me immediately if she has any concerning symptoms in the interval.  All questions were answered. The patient knows to call the clinic with any problems, questions or concerns. We can certainly see the patient much sooner if necessary.  Disclaimer: This note was dictated with voice recognition software. Similar sounding words can inadvertently be transcribed and may not be corrected upon review.

## 2014-10-10 NOTE — Telephone Encounter (Signed)
gv adn printed appt sched and avs for pt for Jan 2017 °

## 2015-09-21 ENCOUNTER — Other Ambulatory Visit: Payer: Self-pay

## 2015-09-21 DIAGNOSIS — Z1231 Encounter for screening mammogram for malignant neoplasm of breast: Secondary | ICD-10-CM

## 2015-10-09 ENCOUNTER — Ambulatory Visit
Admission: RE | Admit: 2015-10-09 | Discharge: 2015-10-09 | Disposition: A | Discharge status: Home / Self Care | Payer: Managed Care, Other (non HMO) | Source: Ambulatory Visit

## 2015-10-09 ENCOUNTER — Ambulatory Visit (HOSPITAL_BASED_OUTPATIENT_CLINIC_OR_DEPARTMENT_OTHER): Payer: Managed Care, Other (non HMO) | Admitting: Internal Medicine

## 2015-10-09 ENCOUNTER — Encounter: Payer: Self-pay | Admitting: Internal Medicine

## 2015-10-09 ENCOUNTER — Other Ambulatory Visit (HOSPITAL_BASED_OUTPATIENT_CLINIC_OR_DEPARTMENT_OTHER): Payer: Managed Care, Other (non HMO)

## 2015-10-09 VITALS — BP 116/71 | HR 62 | Temp 98.9°F | Resp 18 | Ht 67.0 in | Wt 147.8 lb

## 2015-10-09 DIAGNOSIS — C8111 Nodular sclerosis classical Hodgkin lymphoma, lymph nodes of head, face, and neck: Secondary | ICD-10-CM

## 2015-10-09 DIAGNOSIS — C8190 Hodgkin lymphoma, unspecified, unspecified site: Secondary | ICD-10-CM

## 2015-10-09 DIAGNOSIS — Z1231 Encounter for screening mammogram for malignant neoplasm of breast: Secondary | ICD-10-CM

## 2015-10-09 LAB — COMPREHENSIVE METABOLIC PANEL
ALT: 13 U/L (ref 0–55)
ANION GAP: 8 meq/L (ref 3–11)
AST: 20 U/L (ref 5–34)
Albumin: 4.1 g/dL (ref 3.5–5.0)
Alkaline Phosphatase: 39 U/L — ABNORMAL LOW (ref 40–150)
BUN: 11.9 mg/dL (ref 7.0–26.0)
CHLORIDE: 104 meq/L (ref 98–109)
CO2: 27 meq/L (ref 22–29)
Calcium: 9.2 mg/dL (ref 8.4–10.4)
Creatinine: 0.8 mg/dL (ref 0.6–1.1)
EGFR: 88 mL/min/{1.73_m2} — AB (ref >90)
Glucose: 91 mg/dl (ref 70–140)
POTASSIUM: 4.1 meq/L (ref 3.5–5.1)
Sodium: 139 mEq/L (ref 136–145)
Total Bilirubin: 0.77 mg/dL (ref 0.20–1.20)
Total Protein: 7.4 g/dL (ref 6.4–8.3)

## 2015-10-09 LAB — CBC WITH DIFFERENTIAL/PLATELET
BASO%: 0.3 % (ref 0.0–2.0)
BASOS ABS: 0 10*3/uL (ref 0.0–0.1)
EOS ABS: 0.1 10*3/uL (ref 0.0–0.5)
EOS%: 1.3 % (ref 0.0–7.0)
HCT: 39 % (ref 34.8–46.6)
HGB: 13.2 g/dL (ref 11.6–15.9)
LYMPH%: 45.6 % (ref 14.0–49.7)
MCH: 32.6 pg (ref 25.1–34.0)
MCHC: 33.8 g/dL (ref 31.5–36.0)
MCV: 96.3 fL (ref 79.5–101.0)
MONO#: 0.5 10*3/uL (ref 0.1–0.9)
MONO%: 12.4 % (ref 0.0–14.0)
NEUT#: 1.6 10*3/uL (ref 1.5–6.5)
NEUT%: 40.4 % (ref 38.4–76.8)
Platelets: 211 10*3/uL (ref 145–400)
RBC: 4.05 10*6/uL (ref 3.70–5.45)
RDW: 13.1 % (ref 11.2–14.5)
WBC: 3.9 10*3/uL (ref 3.9–10.3)
lymph#: 1.8 10*3/uL (ref 0.9–3.3)

## 2015-10-09 LAB — LACTATE DEHYDROGENASE: LDH: 157 U/L (ref 125–245)

## 2015-10-09 NOTE — Progress Notes (Signed)
[place] Telephone:([phone]   [phone]) [phone]  OFFICE PROGRESS NOTE  DIAGNOSIS: Stage III Hodgkin lymphoma diagnosed in 08/2008.   PRIOR THERAPY:  1. Status post 4 cycles of systemic chemotherapy with ABVD (Adriamycin, bleomycin, Velban, and dacarbazine). Last dose was given 01/27/2009.  2. Status post involved field radiotherapy under the care of Dr. Pablo Ledger. The patient received a total dose of 30.6 Gy between 02/21/2009 through 03/15/2009.   CURRENT THERAPY: Observation.  INTERVAL HISTORY: Brittany Harrington 45 y.o. female returns to the clinic today for annual followup visit. The patient is feeling fine today with no specific complaints. She has intermittent headache every few months and she was seen by her primary care physician and treated for migraine. She denied having any significant chest pain, shortness of breath, cough or hemoptysis. She denied having any significant weight loss or night sweats. She has no nausea or vomiting, no fever or chills. She had annual mammogram at regular basis. The patient had repeat CBC, comprehensive metabolic panel and LDH done earlier today and she is here for evaluation and discussion of her lab results.  ALLERGIES:  has No Known Allergies.  MEDICATIONS:  No current outpatient prescriptions on file.   No current facility-administered medications for this visit.    REVIEW OF SYSTEMS:  A comprehensive review of systems was negative.   PHYSICAL EXAMINATION: General appearance: alert, cooperative and no distress Head: Normocephalic, without obvious abnormality, atraumatic Neck: no adenopathy Lymph nodes: Cervical, supraclavicular, and axillary nodes normal. Resp: clear to auscultation bilaterally Cardio: regular rate and rhythm, S1, S2 normal, no murmur, click, rub or gallop GI: soft, non-tender; bowel sounds normal; no masses,  no organomegaly Extremities: extremities normal, atraumatic, no cyanosis or  edema  ECOG PERFORMANCE STATUS: 0 - Asymptomatic  Blood pressure 116/71, pulse 62, temperature 98.9 F (37.2 C), temperature source Oral, resp. rate 18, height 5\' 7"  (1.702 m), weight 147 lb 12.8 oz (67.042 kg), SpO2 100 %.  LABORATORY DATA: Lab Results  Component Value Date   WBC 3.9 10/09/2015   HGB 13.2 10/09/2015   HCT 39.0 10/09/2015   MCV 96.3 10/09/2015   PLT 211 10/09/2015      Chemistry      Component Value Date/Time   NA 142 10/10/2014 0908   NA 138 08/21/2011 1022   NA 141 08/28/2010 1021   K 4.3 10/10/2014 0908   K 4.3 08/21/2011 1022   K 4.1 08/28/2010 1021   CL 103 09/01/2012 0901   CL 101 08/21/2011 1022   CL 107 08/28/2010 1021   CO2 27 10/10/2014 0908   CO2 29 08/21/2011 1022   CO2 26 08/28/2010 1021   BUN 11.7 10/10/2014 0908   BUN 10 08/21/2011 1022   BUN 12 08/28/2010 1021   CREATININE 0.8 10/10/2014 0908   CREATININE 0.8 08/21/2011 1022   CREATININE 0.90 08/28/2010 1021      Component Value Date/Time   CALCIUM 8.4 10/10/2014 0908   CALCIUM 8.9 08/21/2011 1022   CALCIUM 9.5 08/28/2010 1021   ALKPHOS 41 10/10/2014 0908   ALKPHOS 53 08/21/2011 1022   ALKPHOS 36* 08/28/2010 1021   AST 18 10/10/2014 0908   AST 24 08/21/2011 1022   AST 18 08/28/2010 1021   ALT 11 10/10/2014 0908   ALT 19 08/21/2011 1022   ALT 12 08/28/2010 1021   BILITOT 0.63 10/10/2014 0908   BILITOT 0.80 08/21/2011 1022   BILITOT 1.0 08/28/2010 1021  RADIOGRAPHIC STUDIES:  ASSESSMENT AND PLAN: This is a very pleasant 45 years old white female with history of stage III Hodgkin lymphoma diagnosed in December of 2009 status post systemic chemotherapy followed by involved field radiotherapy and the patient has been observation since June of 2010 with no evidence for disease recurrence. Her annual mammogram is a scheduled for today. CBC is unremarkable today. I recommended for the patient to continue on observation with routine follow-up visit and lab work by her primary  care physician. She was advised to continue with the annual mammogram. I will discharge the patient from the clinic today but she was advised to call immediately if she has any concerning symptoms. She was advised to call me immediately if she has any concerning symptoms in the interval.  All questions were answered. The patient knows to call the clinic with any problems, questions or concerns. We can certainly see the patient much sooner if necessary.  Disclaimer: This note was dictated with voice recognition software. Similar sounding words can inadvertently be transcribed and may not be corrected upon review.
